# Patient Record
Sex: Female | Born: 1965 | Race: White | Hispanic: No | Marital: Married | Smoking: Never smoker
Health system: Southern US, Community
[De-identification: ages and names within clinical notes are randomized; demographics above are authoritative.]

## PROBLEM LIST (undated history)

## (undated) DIAGNOSIS — I10 Essential (primary) hypertension: Secondary | ICD-10-CM

## (undated) HISTORY — PX: WISDOM TOOTH EXTRACTION: SHX21

## (undated) HISTORY — PX: ABDOMINAL HYSTERECTOMY: SHX81

## (undated) HISTORY — PX: APPENDECTOMY: SHX54

## (undated) HISTORY — PX: BREAST BIOPSY: SHX20

---

## 1999-06-24 ENCOUNTER — Emergency Department (HOSPITAL_COMMUNITY): Admission: EM | Admit: 1999-06-24 | Discharge: 1999-06-24 | Payer: Self-pay | Admitting: Emergency Medicine

## 2002-06-23 ENCOUNTER — Observation Stay (HOSPITAL_COMMUNITY): Admission: EM | Admit: 2002-06-23 | Discharge: 2002-06-24 | Payer: Self-pay | Admitting: Emergency Medicine

## 2002-06-23 ENCOUNTER — Encounter (INDEPENDENT_AMBULATORY_CARE_PROVIDER_SITE_OTHER): Payer: Self-pay | Admitting: Specialist

## 2003-01-27 ENCOUNTER — Inpatient Hospital Stay (HOSPITAL_COMMUNITY): Admission: RE | Admit: 2003-01-27 | Discharge: 2003-01-29 | Payer: Self-pay | Admitting: Obstetrics and Gynecology

## 2003-01-27 ENCOUNTER — Encounter (INDEPENDENT_AMBULATORY_CARE_PROVIDER_SITE_OTHER): Payer: Self-pay

## 2005-11-26 ENCOUNTER — Encounter: Admission: RE | Admit: 2005-11-26 | Discharge: 2005-11-26 | Payer: Self-pay | Admitting: Obstetrics and Gynecology

## 2006-06-06 ENCOUNTER — Encounter: Admission: RE | Admit: 2006-06-06 | Discharge: 2006-06-06 | Payer: Self-pay | Admitting: Obstetrics and Gynecology

## 2008-04-22 ENCOUNTER — Encounter: Admission: RE | Admit: 2008-04-22 | Discharge: 2008-04-22 | Payer: Self-pay | Admitting: Obstetrics and Gynecology

## 2010-07-07 NOTE — H&P (Signed)
Sherri Barrett, Sherri Barrett                          ACCOUNT NO.:  [id number]   MEDICAL RECORD NO.:  [id number]                   PATIENT TYPE:  EMS   LOCATION:  ED                                   FACILITY:  [facility]   PHYSICIAN:  Sandria Bales. Ezzard Standing, M.D.               DATE OF BIRTH:  1965/08/20   DATE OF ADMISSION:  06/23/2002  DATE OF DISCHARGE:                                HISTORY & PHYSICAL   HISTORY OF PRESENT ILLNESS:  This is a 45 year old white female who awoke  about 5:30 this morning, developing some vague abdominal pain in her lower  abdomen which localized to the right lower quadrant.  She says it hurts and  burns when she moves or is active. She has had maybe very mild nausea but no  diarrhea, no vomiting, and no fever.  She went to [facility] and saw Dr.  Hal Hope this morning who evaluated her, was worried about appendicitis, and  asked me to see her in the emergency room.   She denies any history of peptic ulcer disease, liver disease, pancreatic  disease, colon disease, change in bowel habits.  Her only prior abdominal  surgery was three C sections.  She has had no other abdominal surgery, no  change in bowel habits, blood in her stools.   PAST MEDICAL HISTORY:  She has no allergies.  She is on no medications.   REVIEW OF SYSTEMS:  NEUROLOGIC:  No seizure or loss of consciousness.  PULMONARY:  No history of pneumonia or tuberculosis.  She does not smoke  cigarettes.  CARDIAC:  No history of heart disease or chest pain.  GASTROINTESTINAL:  See History of Present Illness.  UROLOGIC:  No history of  kidney stones or kidney infections.  She has had at least one UTI but has  never seen a urologist for this.  OB:  She has been pregnant three times  with three C sections.   SOCIAL HISTORY:  She is accompanied by her husband and mother-in-law in the  room.   PHYSICAL EXAMINATION:  VITAL SIGNS:  Temperature 97.6, blood pressure  128/75, pulse 95, respirations 22.  GENERAL:   Well-nourished, pleasant white female, alert and cooperative on  physical exam.  HEENT:  Unremarkable.  NECK:  Supple without mass, thyromegaly, or lymphadenopathy.  LUNGS: Clear to auscultation.  HEART:  Regular rate and rhythm without murmur or rub.  ABDOMEN:  Present bowel sounds.  She has some mild to moderate tenderness  wiht some guarding in the right lower quadrant.  She denied any rebound.  She has no palpable mass, no hernia.  Bowel sounds are active.  GU:  Bimanual exam was done by Dr. Hal Hope which reported normal gynecologic  exam.  RECTAL:  Exam by me revealed no rectal mass.  She does have guaiac-negative  stools.  EXTREMITIES:  Good strength in all extremities.  NEUROLOGIC:  Grossly intact.  LABORATORY DATA:  White blood count is in the normal range with a slight  shift from Dr. Wendee Copp office and labs pending at this time and may be  repeated.  Serum electrolytes and urine pregnancy were negative.    IMPRESSION:  Probable early appendicitis.  Discussed with the patient and  her husband about proceeding with laparoscopic exploration and appendectomy.  I think they understand both the potential that she could not have  appendicitis running maybe 15 to 20% but that an early appendicitis may not  well on any diagnostic tests.   Will proceed with laparoscopic appendectomy at this time.                                               Sandria Bales. Ezzard Standing, M.D.    DHN/MEDQ  D:  06/23/2002  T:  06/23/2002  Job:  [id number]   cc:   Clydie Braun L. Hal Hope, M.D.  [address] [place]  [place] [address]  Fax: [phone]   S. Kyra Manges, M.D.  [id number]  [place]  [place] [address]  Fax: [phone]

## 2010-07-07 NOTE — Op Note (Signed)
NAMEBLIMY, NAPOLEON                          ACCOUNT NO.:  [id number]   MEDICAL RECORD NO.:  [id number]                   PATIENT TYPE:  INP   LOCATION:  0102                                 FACILITY:  [facility]   PHYSICIAN:  Sandria Bales. Ezzard Standing, M.D.               DATE OF BIRTH:  Jun 21, 1965   DATE OF PROCEDURE:  06/23/2002  DATE OF DISCHARGE:                                 OPERATIVE REPORT   PREOPERATIVE DIAGNOSIS:  Appendicitis.   POSTOPERATIVE DIAGNOSES:  Questionable early appendicitis, right ovarian  endometrioma.   PROCEDURE:  Laparoscopic appendectomy and laparoscopic oophorectomy.   SURGEON:  Sandria Bales. Ezzard Standing, M.D. for the laparoscopic appendectomy and Dr.  Elana Alm for the laparoscopic right oophorectomy.   ANESTHESIA:  General endotracheal.   ESTIMATED BLOOD LOSS:  Minimal.   INDICATIONS FOR PROCEDURE:  Ms. Stegner is a 45 year old white female who  awoke this morning with right lower quadrant abdominal pain, some mild  nausea but no vomiting with some guarding in the right lower quadrant but no  rebound and active bowel sounds. Her white blood count was normal with a  shift. She was seen originally by Dr. Kandis Mannan at [facility] who asked me to see her in consultation. My impression was that Ms.  Slane had probable early appendicitis. I discussed with her and her husband  about proceeding with attempted laparoscopic appendectomy. I discussed both  the indications and potential complications of the procedure. The potential  complications include but are not limited to bleeding, infection, that it  may not be an appendicitis and some other disease and the need for open  surgery.   DESCRIPTION OF PROCEDURE:  The patient placed in supine position, given a  general endotracheal anesthetic. She had 1 g of ________ before the  operation, she was in PAS stockings, she had a Foley catheter in place, her  abdomen was prepped with Betadine solution and  sterilely draped.   An infraumbilical incision was made and with sharp dissection carried down  to the abdominal cavity. A 12 mm Hasson trocar was inserted through a 12 mm  trocar and secured with a #0 Vicryl suture. Two additional trocars were  placed, a 5 mm Ethicon trocar on the right subcostal location and a 10 mm  trocar in the left lower quadrant location. Abdominal exploration was  carried out, right and left lobe of the liver unremarkable, gallbladder  unremarkable. The appendix had a thickening to the base of the appendix  which may have been early appendicitis but I saw no purulence and no obvious  induration. She had a boggy large uterus and a couple of fibroids. She had a  very large right ovary which probably measured 6-7 cm in length and about 3-  4 cm of width. She had a smaller left ovary which was maybe about 2-3 cm in  size.   I went  on and took her appendix out dividing the mesentery of the appendix  with the harmonic scalpel and the base of the appendix with a 45 mm white  load of endoGIA. The appendix was then placed in an EndoCatch bag, delivered  through the umbilicus and sent to pathology. In manipulating this left  ovary, I was concerned about two things, first this large and I thought more  than just simple ovarian changes with menstrual cycle. The second thing, it  was on a pedicle which was easily twisted back and forth and I worried about  this ovary torting.   I spoke with Dr. Kyra Manges who had been her gynecologist in the past and  he came in and examined her with me and I assisted him in doing a left  laparoscopic oophorectomy and he will dictate that portion of the operation.   I also ran her small bowel from the terminal ileum back about 4 or 5 feet  making sure there was no evidence of Meckel's diverticulum and there was no  other inflammatory mass or lesion in her abdominal cavity.   My feeling is that Ms. Reichenbach had probably an early  appendicitis. She also  had a large right ovarian endometrioma, which of the two were causing her  symptoms and problems would be unclear and have to see the final path that  is on her appendix.   I then removed the trocars in turn. I examined the abdominal wall making  sure there was no bleeding from any trocar site. The umbilical trocar was  closed with a #0 Vicryl suture. The skin at each site was closed with 5-0  Monocryl suture, painted with tinctured Benzoin and Steri-Strips and  sterilely dressed.   Sponge and needle count were correct at the end of the case. I removed the  tenaculum that had been placed in the patient's uterus during the middle of  the case. Her Foley was removed at the end of the case. She tolerated the  procedure well and was returned to the recovery room in good condition.                                                Sandria Bales. Ezzard Standing, M.D.    DHN/MEDQ  D:  06/23/2002  T:  06/24/2002  Job:  [id number]

## 2010-07-07 NOTE — Discharge Summary (Signed)
[name], Sherri Barrett                          ACCOUNT NO.:  [id number]   MEDICAL RECORD NO.:  [id number]                   PATIENT TYPE:  INP   LOCATION:  0446                                 FACILITY:  [facility]   PHYSICIAN:  Katherine Roan, M.D.               DATE OF BIRTH:  [birth year]   DATE OF ADMISSION:  01/27/2003  DATE OF DISCHARGE:  01/29/2003                                 DISCHARGE SUMMARY   PREOPERATIVE DIAGNOSES:  1. Dysmenorrhea.  2. Endometriosis.   DISCHARGE DIAGNOSES:  1. Dysmenorrhea.  2. Endometriosis.   OPERATION PERFORMED:  1. Pelvic examination under anesthesia.  2. Total abdominal hysterectomy.  3. Fulguration of endometriosis of left ovary.   BRIEF HISTORY:  [name] is status post laparoscopy and appendectomy with  findings of acute endometriosis of the appendix and the right tube and  ovary.  We removed her right tube and ovary and appendix.  She, at the time  of surgery, also had focal endometriosis on her left ovary.  She was  complaining of heavy painful periods which really did not resolve with oral  contraceptive therapy and she was now admitted for definitive care.  Hemoglobin on admission was 12.  Hematocrit was 36.  Pathology report  revealed uterine fibroids.   HOSPITAL COURSE:  On the day of admission the patient underwent an  examination under anesthesia, fulguration of endometriosis of the left ovary  with ovarian preservation, and total abdominal hysterectomy.  Her  postoperative course was uncomplicated.  She remained afebrile and without  complaints and was discharged on December 10 to home and office care.  She  was asked to call for fever, bleeding, or any other difficulty.   CONDITION ON DISCHARGE:  Improved.                                               Katherine Roan, M.D.    SDM/MEDQ  D:  02/16/2003  T:  02/16/2003  Job:  [id number]

## 2010-07-07 NOTE — H&P (Signed)
Sherri Barrett, Sherri Barrett                          ACCOUNT NO.:  [id number]   MEDICAL RECORD NO.:  [id number]                   PATIENT TYPE:  INP   LOCATION:  NA                                   FACILITY:  [facility]   PHYSICIAN:  Katherine Roan, M.D.               DATE OF BIRTH:  15-Feb-1966   DATE OF ADMISSION:  DATE OF DISCHARGE:                                HISTORY & PHYSICAL   CHIEF COMPLAINT:  Pelvic pain and dysmenorrhea.   HISTORY OF PRESENT ILLNESS:  Sherri Barrett is a 45 year old gravida 3, para 3,  status post cesarean section x3 who in May of 2004 underwent a laparoscopy  with the finding of a large endometriotic cyst of her right ovary and  endometriosis involving the appendix.  She underwent a laparoscopic  appendectomy and right salpingo-oophorectomy.  At the time of the initial  surgery which was done on an emergency basis, there was endometriosis in the  residual ovary.  She continues to have pelvic pain and is desirous of  completion of her therapy for endometriosis.  She has no known allergies and  is on no medications.   REVIEW OF SYSTEMS:  HEENT:  She wears glasses but no decrease in visual or  auditory acuity.  No dizziness.  HEART:  No hypertension, no rheumatic  fever, no history of mitral valve prolapse. LUNGS:  No chronic cough.  She  does not smoke.  No shortness of breath.  No history of asthma.  GU:  She  denies stress incontinence.  GI:  No bowel habit change.  No melena.  MUSCLE, BONES, AND JOINTS:  No fractures or arthritis.   SOCIAL HISTORY:  She works regularly, does not smoke or drink.   FAMILY HISTORY:  Her mother and father are both living and well.  One  brother is in good health.  Her mother had breast cancer and her father has  had a heart attack at age 42.  He is doing well at this time.   PHYSICAL EXAMINATION:  GENERAL:  A well-developed, nourished, alert,  pleasant 45 year old female who is oriented to time, place, and recent  events.  VITAL SIGNS:   Weight 193, blood pressure 130/80, pulse 80.  EARS, NOSE AND THROAT:  Unremarkable.  Oropharynx is not injected.  NECK:  Supple.  Carotid pulses are equal without bruits.  Trachea is in the  midline.  The thyroid does not appear to be enlarged.  No adenopathy  appreciated.  BREASTS:  No masses or tenderness.  Axilla free from adenopathy.  LUNGS:  Clear to P&A.  HEART:  Normal sinus rhythm.  No murmurs.  ABDOMEN:  Soft, on plain.  Liver, spleen, or kidneys are not palpated.  Bowel sounds are normal and no bruits are heard.  No tenderness.  There is a  low transverse incision.  EXTREMITIES:  Trace of edema, equal pulses and reflexes.  PELVIC:  Reveals a clean cervix.  Uterus is in the midline, slightly boggy  and tender in the left adnexa.  No masses are felt. Rectovaginal confirms.   IMPRESSION:  Endometriosis of pelvis.   PLAN:  TAH, LSO with hormone replacement therapy.  Risks and benefits have  been discussed with [name].  Specifically, we have addressed the issue of  pelvic pain, persistent, with damage to bladder, bowel, and infection and  hemorrhage. She is aware of these risks and desires to proceed.                                               Katherine Roan, M.D.    SDM/MEDQ  D:  01/25/2003  T:  01/25/2003  Job:  [id number]

## 2010-07-07 NOTE — Op Note (Signed)
   NAME:  Sherri Barrett, Sherri Barrett                          ACCOUNT NO.:  [id number]   MEDICAL RECORD NO.:  [id number]                   PATIENT TYPE:  INP   LOCATION:  0440                                 FACILITY:  [facility]   PHYSICIAN:  Katherine Roan, M.D.               DATE OF BIRTH:   DATE OF PROCEDURE:  06/23/2002  DATE OF DISCHARGE:                                 OPERATIVE REPORT   INDICATIONS FOR PROCEDURE:  The patient was in the operating room and had a  laparoscopic appendectomy by Dr. Ezzard Standing.  There was a 12 mm port in the  umbilicus, a 10 mm port in the left lower quadrant, and a 5 mm port in the  right mid quadrant.  Visualization of the pelvis revealed an enlarged  myomatous uterus.  She is status post cesarean section and tubal  sterilization.  The right ovary contained a large 8 cm hemorrhagic mass.  The left ovary had some discolored material on the surface, but appeared to  be within normal limits.  Because of the large area on the ovary and the  evidence that it was an endometrioma, a right salpingo-oophorectomy was  performed.   DESCRIPTION OF PROCEDURE:  We tried to use a Hulka elevator, but since she  was only in the frog-leg position I was unable to manipulate the uterus well  at all.  We lifted up the ovary, brought it up anteriorly over the uterus,  and transected the infundibulopelvic ligament and the utero-ovarian ligament  to remove the large endometrioma.  This was then extracted through the  umbilical port with Endo bag.  Following this  Dr. Ezzard Standing closed the incisions.   DISPOSITION:  The patient tolerated the procedure well.   FINAL DIAGNOSIS:  Endometrioma of right ovary.                                               Katherine Roan, M.D.    SDM/MEDQ  D:  06/23/2002  T:  06/24/2002  Job:  [id number]   cc:   Sandria Bales. Ezzard Standing, M.D.  [address]  [place]  [place] [address]  Fax: [phone]

## 2010-07-07 NOTE — Op Note (Signed)
NAMEJERLENE, ROCKERS                          ACCOUNT NO.:  [id number]   MEDICAL RECORD NO.:  [id number]                   PATIENT TYPE:  INP   LOCATION:  V784                                 FACILITY:  [facility]   PHYSICIAN:  Katherine Roan, M.D.               DATE OF BIRTH:  1965/03/28   DATE OF PROCEDURE:  DATE OF DISCHARGE:                                 OPERATIVE REPORT   PREOPERATIVE DIAGNOSES:  Dysmenorrhea and pelvic pain, history of  endometriosis.   POSTOPERATIVE DIAGNOSES:  Dysmenorrhea and pelvic pain, history of  endometriosis.   OPERATION:  Pelvic exam under anesthesia, exploratory laparotomy, total  abdominal hysterectomy, fulguration of focal endometriosis of left ovary.   DESCRIPTION OF PROCEDURE:  The patient was placed in the lithotomy position,  general endotracheal anesthesia was instituted. Pelvic exam under anesthesia  revealed a mobile uterus with no adnexal masses.  Prepped and draped in the  usual fashion, the incision was infiltrated with 20 mL of 0.5% Marcaine  following which I made a transverse Pfannenstiel incision extended in layers  to the fascia which was opened transversely.  Hemostasis was accomplished  with the Bovie. The midline of the fascia was then dissected superiorly and  inferiorly and the midline was entered.  The exploration of the upper  abdomen revealed a smooth liver, gallbladder, two normal kidneys.  The  abdominal viscera were packed away from the pelvic viscera, the uterus was  normal, there was focal endometriosis on the right side of the uterus.  The  left ovary was cystic but no evidence of endometriosis other than one small  area. Because of her age, I elected to save the ovary.  The cornual aspects  of the uterus was grasped, round ligaments were suture ligated, the  uteroovarian ligament and tubal structure was clamped and ligated with #0  Chromic suture. The infundibulopelvic ligament on the right where she  previously had  a right salpingo-oophorectomy was clamped and ligated.  Uterine vessels were skeletonized.  The bladder flap was pushed off the  lower segment.  Using careful dissection, the uterus removed using the  clamps suturing with #0 chromic suture.  The uterosacral ligaments were  identified and clamped and ligated with #0 chromic, they were tagged. The  angles of the vagina were entered, the vagina was then closed with a figure-  of-eight suture of #0 Vicryl.  Hemostasis was secure.  We did a culdoplasty  in order to prevent enterocele formation. The retroperitoneal space was then  examined, there was no oozing and I closed that with interrupted sutures of  3-0 Vicryl.  Following this, I looked at the ovary again and there was a  focal area of endometriosis on it and so I treated that with Bovie.  I  sutured the left ovary to the round ligament with three  sutures of 3-0 Vicryl.  Rochel tolerated this part of the  procedure.  Estimated blood loss was about 100 mL.  The parietoperitoneum was then  closed with 2-0 PDS, the fascia was closed with 2-0 PDS and the skin was  closed with 3-0 plain gut.  The patient tolerated the procedure well.                                               Katherine Roan, M.D.    SDM/MEDQ  D:  01/27/2003  T:  01/28/2003  Job:  [id number]   cc:   Sandria Bales. Ezzard Standing, M.D.  [address], [address]  [place]  [place] [address]  Fax: [phone]   Raynald Kemp, M.D.  [address]  [place]  [place] [address]  Fax: [phone]

## 2011-03-30 ENCOUNTER — Other Ambulatory Visit: Payer: Self-pay | Admitting: Family Medicine

## 2011-03-30 DIAGNOSIS — N644 Mastodynia: Secondary | ICD-10-CM

## 2011-04-02 ENCOUNTER — Other Ambulatory Visit: Payer: Self-pay | Admitting: Family Medicine

## 2011-04-02 ENCOUNTER — Ambulatory Visit
Admission: RE | Admit: 2011-04-02 | Discharge: 2011-04-02 | Disposition: A | Discharge status: Home / Self Care | Payer: 59 | Source: Ambulatory Visit | Attending: Family Medicine | Admitting: Family Medicine

## 2011-04-02 DIAGNOSIS — N644 Mastodynia: Secondary | ICD-10-CM

## 2011-04-02 MED ORDER — DOXYCYCLINE HYCLATE 100 MG PO TABS: 100.0000 mg | ORAL_TABLET | Freq: Two times a day (BID) | ORAL | Status: AC

## 2011-04-16 ENCOUNTER — Other Ambulatory Visit: Payer: Self-pay | Admitting: Family Medicine

## 2011-04-16 ENCOUNTER — Ambulatory Visit
Admission: RE | Admit: 2011-04-16 | Discharge: 2011-04-16 | Disposition: A | Discharge status: Home / Self Care | Payer: 59 | Source: Ambulatory Visit | Attending: Family Medicine | Admitting: Family Medicine

## 2011-04-16 DIAGNOSIS — N644 Mastodynia: Secondary | ICD-10-CM

## 2011-05-01 ENCOUNTER — Ambulatory Visit
Admission: RE | Admit: 2011-05-01 | Discharge: 2011-05-01 | Disposition: A | Discharge status: Home / Self Care | Payer: 59 | Source: Ambulatory Visit | Attending: Family Medicine | Admitting: Family Medicine

## 2011-05-01 ENCOUNTER — Other Ambulatory Visit: Payer: Self-pay | Admitting: Family Medicine

## 2011-05-01 DIAGNOSIS — N644 Mastodynia: Secondary | ICD-10-CM

## 2012-05-05 ENCOUNTER — Other Ambulatory Visit: Payer: Self-pay

## 2012-05-05 DIAGNOSIS — Z1231 Encounter for screening mammogram for malignant neoplasm of breast: Secondary | ICD-10-CM

## 2012-05-23 ENCOUNTER — Ambulatory Visit
Admission: RE | Admit: 2012-05-23 | Discharge: 2012-05-23 | Disposition: A | Discharge status: Home / Self Care | Payer: 59 | Source: Ambulatory Visit

## 2012-05-23 DIAGNOSIS — Z1231 Encounter for screening mammogram for malignant neoplasm of breast: Secondary | ICD-10-CM

## 2013-07-06 ENCOUNTER — Other Ambulatory Visit: Payer: Self-pay

## 2013-07-06 DIAGNOSIS — Z1231 Encounter for screening mammogram for malignant neoplasm of breast: Secondary | ICD-10-CM

## 2013-07-27 ENCOUNTER — Ambulatory Visit
Admission: RE | Admit: 2013-07-27 | Discharge: 2013-07-27 | Disposition: A | Discharge status: Home / Self Care | Payer: 59 | Source: Ambulatory Visit

## 2013-07-27 ENCOUNTER — Encounter (INDEPENDENT_AMBULATORY_CARE_PROVIDER_SITE_OTHER): Payer: Self-pay

## 2013-07-27 DIAGNOSIS — Z1231 Encounter for screening mammogram for malignant neoplasm of breast: Secondary | ICD-10-CM

## 2013-12-30 ENCOUNTER — Other Ambulatory Visit: Payer: Self-pay | Admitting: Nurse Practitioner

## 2013-12-30 DIAGNOSIS — N63 Unspecified lump in unspecified breast: Secondary | ICD-10-CM

## 2013-12-31 ENCOUNTER — Encounter: Payer: Self-pay | Admitting: Internal Medicine

## 2014-01-12 ENCOUNTER — Ambulatory Visit
Admission: RE | Admit: 2014-01-12 | Discharge: 2014-01-12 | Disposition: A | Discharge status: Home / Self Care | Payer: 59 | Source: Ambulatory Visit | Attending: Nurse Practitioner | Admitting: Nurse Practitioner

## 2014-01-12 DIAGNOSIS — N63 Unspecified lump in unspecified breast: Secondary | ICD-10-CM

## 2014-02-25 ENCOUNTER — Emergency Department (HOSPITAL_COMMUNITY)
Admission: EM | Admit: 2014-02-25 | Discharge: 2014-02-25 | Disposition: A | Discharge status: Home / Self Care | Payer: 59 | Attending: Emergency Medicine | Admitting: Emergency Medicine

## 2014-02-25 ENCOUNTER — Encounter (HOSPITAL_COMMUNITY): Payer: Self-pay | Admitting: *Deleted

## 2014-02-25 DIAGNOSIS — R55 Syncope and collapse: Secondary | ICD-10-CM

## 2014-02-25 DIAGNOSIS — I951 Orthostatic hypotension: Secondary | ICD-10-CM | POA: Diagnosis not present

## 2014-02-25 DIAGNOSIS — E876 Hypokalemia: Secondary | ICD-10-CM | POA: Insufficient documentation

## 2014-02-25 DIAGNOSIS — Z79899 Other long term (current) drug therapy: Secondary | ICD-10-CM | POA: Diagnosis not present

## 2014-02-25 DIAGNOSIS — I10 Essential (primary) hypertension: Secondary | ICD-10-CM | POA: Insufficient documentation

## 2014-02-25 HISTORY — DX: Essential (primary) hypertension: I10

## 2014-02-25 LAB — CBC WITH DIFFERENTIAL/PLATELET
Basophils Absolute: 0 10*3/uL (ref 0.0–0.1)
Basophils Relative: 0 % (ref 0–1)
EOS ABS: 0.1 10*3/uL (ref 0.0–0.7)
EOS PCT: 1 % (ref 0–5)
HCT: 38.5 % (ref 36.0–46.0)
HEMOGLOBIN: 13.3 g/dL (ref 12.0–15.0)
Lymphocytes Relative: 11 % — ABNORMAL LOW (ref 12–46)
Lymphs Abs: 1 10*3/uL (ref 0.7–4.0)
MCH: 31.4 pg (ref 26.0–34.0)
MCHC: 34.5 g/dL (ref 30.0–36.0)
MCV: 90.8 fL (ref 78.0–100.0)
Monocytes Absolute: 0.5 10*3/uL (ref 0.1–1.0)
Monocytes Relative: 6 % (ref 3–12)
Neutro Abs: 7.2 10*3/uL (ref 1.7–7.7)
Neutrophils Relative %: 82 % — ABNORMAL HIGH (ref 43–77)
PLATELETS: 199 10*3/uL (ref 150–400)
RBC: 4.24 MIL/uL (ref 3.87–5.11)
RDW: 12.7 % (ref 11.5–15.5)
WBC: 8.7 10*3/uL (ref 4.0–10.5)

## 2014-02-25 LAB — COMPREHENSIVE METABOLIC PANEL
ALK PHOS: 60 U/L (ref 39–117)
ALT: 15 U/L (ref 0–35)
AST: 24 U/L (ref 0–37)
Albumin: 4 g/dL (ref 3.5–5.2)
Anion gap: 14 (ref 5–15)
BILIRUBIN TOTAL: 1.2 mg/dL (ref 0.3–1.2)
BUN: 11 mg/dL (ref 6–23)
CHLORIDE: 100 meq/L (ref 96–112)
CO2: 19 mmol/L (ref 19–32)
Calcium: 9.2 mg/dL (ref 8.4–10.5)
Creatinine, Ser: 0.8 mg/dL (ref 0.50–1.10)
GFR calc Af Amer: >90 mL/min (ref >90)
GFR calc non Af Amer: 85 mL/min — ABNORMAL LOW (ref >90)
Glucose, Bld: 103 mg/dL — ABNORMAL HIGH (ref 70–99)
POTASSIUM: 3.1 mmol/L — AB (ref 3.5–5.1)
SODIUM: 133 mmol/L — AB (ref 135–145)
Total Protein: 7 g/dL (ref 6.0–8.3)

## 2014-02-25 MED ORDER — POTASSIUM CHLORIDE CRYS ER 20 MEQ PO TBCR
40.0000 meq | EXTENDED_RELEASE_TABLET | Freq: Once | ORAL | Status: AC
  Administered 2014-02-25: 40 meq via ORAL
  Filled 2014-02-25: qty 2

## 2014-02-25 NOTE — ED Notes (Signed)
CBG 126  

## 2014-02-25 NOTE — ED Notes (Signed)
Pt in via EMS, per EMS pt was working out x1 hour and developed numbness in hands and feet and felt dizzy, positive orthostatics for EMS 740 systolic HR 814 to 481 systolic HR 856 standing, started BP medication in December, has not eaten since 1pm today, symptoms improved with fluid bolus.

## 2014-02-25 NOTE — Discharge Instructions (Signed)
Hypokalemia Hypokalemia means that the amount of potassium in the blood is lower than normal.Potassium is a chemical, called an electrolyte, that helps regulate the amount of fluid in the body. It also stimulates muscle contraction and helps nerves function properly.Most of the body's potassium is inside of cells, and only a very small amount is in the blood. Because the amount in the blood is so small, minor changes can be life-threatening. CAUSES  Antibiotics.  Diarrhea or vomiting.  Using laxatives too much, which can cause diarrhea.  Chronic kidney disease.  Water pills (diuretics).  Eating disorders (bulimia).  Low magnesium level.  Sweating a lot. SIGNS AND SYMPTOMS  Weakness.  Constipation.  Fatigue.  Muscle cramps.  Mental confusion.  Skipped heartbeats or irregular heartbeat (palpitations).  Tingling or numbness. DIAGNOSIS  Your health care provider can diagnose hypokalemia with blood tests. In addition to checking your potassium level, your health care provider may also check other lab tests. TREATMENT Hypokalemia can be treated with potassium supplements taken by mouth or adjustments in your current medicines. If your potassium level is very low, you may need to get potassium through a vein (IV) and be monitored in the hospital. A diet high in potassium is also helpful. Foods high in potassium are:  Nuts, such as peanuts and pistachios.  Seeds, such as sunflower seeds and pumpkin seeds.  Peas, lentils, and lima beans.  Whole grain and bran cereals and breads.  Fresh fruit and vegetables, such as apricots, avocado, bananas, cantaloupe, kiwi, oranges, tomatoes, asparagus, and potatoes.  Orange and tomato juices.  Red meats.  Fruit yogurt. HOME CARE INSTRUCTIONS  Take all medicines as prescribed by your health care provider.  Maintain a healthy diet by including nutritious food, such as fruits, vegetables, nuts, whole grains, and lean meats.  If  you are taking a laxative, be sure to follow the directions on the label. SEEK MEDICAL CARE IF:  Your weakness gets worse.  You feel your heart pounding or racing.  You are vomiting or having diarrhea.  You are diabetic and having trouble keeping your blood glucose in the normal range. SEEK IMMEDIATE MEDICAL CARE IF:  You have chest pain, shortness of breath, or dizziness.  You are vomiting or having diarrhea for more than 2 days.  You faint. MAKE SURE YOU:   Understand these instructions.  Will watch your condition.  Will get help right away if you are not doing well or get worse. Document Released: 02/05/2005 Document Revised: 11/26/2012 Document Reviewed: 08/08/2012 [facility] Patient Information 2015 [place], [place]. This information is not intended to replace advice given to you by your health care provider. Make sure you discuss any questions you have with your health care provider.  Near-Syncope Near-syncope (commonly known as near fainting) is sudden weakness, dizziness, or feeling like you might pass out. During an episode of near-syncope, you may also develop pale skin, have tunnel vision, or feel sick to your stomach (nauseous). Near-syncope may occur when getting up after sitting or while standing for a long time. It is caused by a sudden decrease in blood flow to the brain. This decrease can result from various causes or triggers, most of which are not serious. However, because near-syncope can sometimes be a sign of something serious, a medical evaluation is required. The specific cause is often not determined. HOME CARE INSTRUCTIONS  Monitor your condition for any changes. The following actions may help to alleviate any discomfort you are experiencing:  Have someone stay with you  until you feel stable.  Lie down right away and prop your feet up if you start feeling like you might faint. Breathe deeply and steadily. Wait until all the symptoms have passed. Most of these  episodes last only a few minutes. You may feel tired for several hours.   Drink enough fluids to keep your urine clear or pale yellow.   If you are taking blood pressure or heart medicine, get up slowly when seated or lying down. Take several minutes to sit and then stand. This can reduce dizziness.  Follow up with your health care provider as directed. SEEK IMMEDIATE MEDICAL CARE IF:   You have a severe headache.   You have unusual pain in the chest, abdomen, or back.   You are bleeding from the mouth or rectum, or you have black or tarry stool.   You have an irregular or very fast heartbeat.   You have repeated fainting or have seizure-like jerking during an episode.   You faint when sitting or lying down.   You have confusion.   You have difficulty walking.   You have severe weakness.   You have vision problems.  MAKE SURE YOU:  Orthostatic Hypotension Orthostatic hypotension is a sudden drop in blood pressure. It happens when you quickly stand up from a seated or lying position. You may feel dizzy or light-headed. This can last for just a few seconds or for up to a few minutes. It is usually not a serious problem. However, if this happens frequently or gets worse, it can be a sign of something more serious. CAUSES  Different things can cause orthostatic hypotension, including:   Loss of body fluids (dehydration).  Medicines that lower blood pressure.  Sudden changes in posture, such as standing up quickly after you have been sitting or lying down.  Taking too much of your medicine. SIGNS AND SYMPTOMS   Light-headedness or dizziness.   Fainting or near-fainting.   A fast heart rate.   Weakness.   Feeling tired (fatigue).  DIAGNOSIS  Your health care provider may do several things to help diagnose your condition and identify the cause. These may include:   Taking a medical history and doing a physical exam.  Checking your blood pressure.  Your health care provider will check your blood pressure when you are:  Lying down.  Sitting.  Standing.  Using tilt table testing. In this test, you lie down on a table that moves from a lying position to a standing position. You will be strapped onto the table. This test monitors your blood pressure and heart rate when you are in different positions. TREATMENT  Treatment will vary depending on the cause. Possible treatments include:   Changing the dosage of your medicines.  Wearing compression stockings on your lower legs.  Standing up slowly after sitting or lying down.  Eating more salt.  Eating frequent, small meals.  In some cases, getting IV fluids.  Taking medicine to enhance fluid retention. HOME CARE INSTRUCTIONS  Only take over-the-counter or prescription medicines as directed by your health care provider.  Follow your health care provider's instructions for changing the dosage of your current medicines.  Do not stop or adjust your medicine on your own.  Stand up slowly after sitting or lying down. This allows your body to adjust to the different position.  Wear compression stockings as directed.  Eat extra salt as directed.  Do not add extra salt to your diet unless directed to by  your health care provider.  Eat frequent, small meals.  Avoid standing suddenly after eating.  Avoid hot showers or excessive heat as directed by your health care provider.  Keep all follow-up appointments. SEEK MEDICAL CARE IF:  You continue to feel dizzy or light-headed after standing.  You feel groggy or confused.  You feel cold, clammy, or sick to your stomach (nauseous).  You have blurred vision.  You feel short of breath. SEEK IMMEDIATE MEDICAL CARE IF:   You faint after standing.  You have chest pain.  You have difficulty breathing.   You lose feeling or movement in your arms or legs.   You have slurred speech or difficulty talking, or you are  unable to talk.  MAKE SURE YOU:   Understand these instructions.  Will watch your condition.  Will get help right away if you are not doing well or get worse. Document Released: 01/26/2002 Document Revised: 02/10/2013 Document Reviewed: 11/28/2012 [facility] Patient Information 2015 [place], [place]. This information is not intended to replace advice given to you by your health care provider. Make sure you discuss any questions you have with your health care provider.   Understand these instructions.  Will watch your condition.  Will get help right away if you are not doing well or get worse. Document Released: 02/05/2005 Document Revised: 02/10/2013 Document Reviewed: 07/11/2012 [facility] Patient Information 2015 [place], [place]. This information is not intended to replace advice given to you by your health care provider. Make sure you discuss any questions you have with your health care provider.

## 2014-02-25 NOTE — ED Provider Notes (Signed)
CSN: [id number]     Arrival date & time 02/25/14  2011 History   First MD Initiated Contact with Patient 02/25/14 2155     Chief Complaint  Patient presents with  . Near Syncope    Patient is a 49 y.o. female presenting with near-syncope. The history is provided by the patient. No language interpreter was used.  Near Syncope   Ms Holdsworth presents for evaluation of near-syncope. She was working out much longer than she normally does today. When she went to leave the gym she developed numbness in bilateral hands and feet and felt like she might pass out. She went to lay down. She thought that she needed a snack since she hadn't eaten since 1 this afternoon so she tried to eat something but that made her feel nauseous. When EMS arrived she was found to be orthostatic and brought into the emergency department for further evaluation. Patient denies any chest pain, shortness of breath, leg swelling or pain. She has no history of similar previous symptoms. She was started on HCTZ for high blood pressure about a month ago and she had a recheck not that long ago where she maintained her current dosage. She currently has no symptoms and feels improved compared to earlier today.  Past Medical History  Diagnosis Date  . Hypertension    History reviewed. No pertinent past surgical history. History reviewed. No pertinent family history. History  Substance Use Topics  . Smoking status: Never Smoker   . Smokeless tobacco: Not on file  . Alcohol Use: Not on file   OB History    No data available     Review of Systems  Cardiovascular: Positive for near-syncope.  All other systems reviewed and are negative.     Allergies  Review of patient's allergies indicates no known allergies.  Home Medications   Prior to Admission medications   Medication Sig Start Date End Date Taking? Authorizing Provider  hydrochlorothiazide (HYDRODIURIL) 25 MG tablet Take 25 mg by mouth daily.   Yes Historical  Provider, MD   BP 125/75 mmHg  Pulse 89  Temp(Src) 97.8 F (36.6 C) (Oral)  Resp 19  Ht 5\' 6"  (1.676 m)  Wt 202 lb (91.627 kg)  BMI 32.62 kg/m2  SpO2 99% Physical Exam  Constitutional: She is oriented to person, place, and time. She appears well-developed and well-nourished.  HENT:  Head: Normocephalic and atraumatic.  Cardiovascular: Normal rate and regular rhythm.   No murmur heard. Pulmonary/Chest: Effort normal and breath sounds normal. No respiratory distress.  Abdominal: Soft. There is no tenderness. There is no rebound and no guarding.  Musculoskeletal: She exhibits no edema or tenderness.  Neurological: She is alert and oriented to person, place, and time.  Skin: Skin is warm and dry.  Psychiatric: She has a normal mood and affect. Her behavior is normal.  Nursing note and vitals reviewed.   ED Course  Procedures (including critical care time) Labs Review Labs Reviewed  CBC WITH DIFFERENTIAL - Abnormal; Notable for the following:    Neutrophils Relative % 82 (*)    Lymphocytes Relative 11 (*)    All other components within normal limits  COMPREHENSIVE METABOLIC PANEL - Abnormal; Notable for the following:    Sodium 133 (*)    Potassium 3.1 (*)    Glucose, Bld 103 (*)    GFR calc non Af Amer 85 (*)    All other components within normal limits    Imaging Review No results found.  EKG Interpretation   Date/Time:  Thursday February 25 2014 20:26:48 EST Ventricular Rate:  81 PR Interval:  161 QRS Duration: 104 QT Interval:  449 QTC Calculation: 521 R Axis:   52 Text Interpretation:  Sinus rhythm Prolonged QT interval Confirmed by  Hazle Coca [id number]) on 02/25/2014 8:34:55 PM Also confirmed by Hazle Coca  [phone])  on 02/25/2014 10:32:44 PM      MDM   Final diagnoses:  Near syncope  Orthostatic hypotension  Hypokalemia    Patient here for evaluation of near syncope after exercise. Symptoms did not occur during exercise. Patient is orthostatic by EMS and  on recheck in the emergency department. Patient is tolerating oral fluids quite well, discussed with patient oral fluid hydration. Patient is mildly hypo-[facility] [place] hypokalemic, this is likely secondary to HCTZ use. Discussed with patient and PCP follow-up M.D. Seeing HCTZ for now. Discussed oral fluid hydration. Discussed close return precautions. Discussed abstaining from heavy activity until recheck.clinical picture not consistent with ACS, PE, life-threatening arrhythmia.    Quintella Reichert, MD 02/25/14 [phone]

## 2014-02-25 NOTE — ED Notes (Signed)
MD at bedside. 

## 2014-03-05 ENCOUNTER — Encounter: Payer: 59 | Admitting: Internal Medicine

## 2014-03-16 ENCOUNTER — Encounter: Payer: Self-pay | Admitting: Nurse Practitioner

## 2014-03-18 ENCOUNTER — Ambulatory Visit (AMBULATORY_SURGERY_CENTER): Payer: Self-pay | Admitting: *Deleted

## 2014-03-18 VITALS — Ht 66.0 in | Wt 206.4 lb

## 2014-03-18 DIAGNOSIS — Z1211 Encounter for screening for malignant neoplasm of colon: Secondary | ICD-10-CM

## 2014-03-18 MED ORDER — MOVIPREP 100 G PO SOLR: 1.0000 | Freq: Once | ORAL | Status: DC

## 2014-03-18 NOTE — Progress Notes (Signed)
No home 02 use No diet pills No issues with past sedation No egg or soy allergy Pt declined emmi

## 2014-04-01 ENCOUNTER — Encounter: Payer: 59 | Admitting: Internal Medicine

## 2014-04-01 ENCOUNTER — Encounter: Payer: Self-pay | Admitting: Internal Medicine

## 2014-04-01 ENCOUNTER — Ambulatory Visit (AMBULATORY_SURGERY_CENTER): Payer: 59 | Admitting: Internal Medicine

## 2014-04-01 VITALS — BP 117/64 | HR 56 | Temp 97.4°F | Resp 39 | Ht 66.0 in | Wt 206.0 lb

## 2014-04-01 DIAGNOSIS — D124 Benign neoplasm of descending colon: Secondary | ICD-10-CM

## 2014-04-01 DIAGNOSIS — Z1211 Encounter for screening for malignant neoplasm of colon: Secondary | ICD-10-CM

## 2014-04-01 DIAGNOSIS — D128 Benign neoplasm of rectum: Secondary | ICD-10-CM

## 2014-04-01 DIAGNOSIS — K621 Rectal polyp: Secondary | ICD-10-CM

## 2014-04-01 DIAGNOSIS — D129 Benign neoplasm of anus and anal canal: Secondary | ICD-10-CM

## 2014-04-01 DIAGNOSIS — K635 Polyp of colon: Secondary | ICD-10-CM

## 2014-04-01 MED ORDER — SODIUM CHLORIDE 0.9 % IV SOLN: 500.0000 mL | INTRAVENOUS | Status: DC

## 2014-04-01 NOTE — Progress Notes (Signed)
Patient awakening,vss,report to rn 

## 2014-04-01 NOTE — Patient Instructions (Signed)

## 2014-04-01 NOTE — Progress Notes (Signed)
Called to room to assist during endoscopic procedure.  Patient ID and intended procedure confirmed with present staff. Received instructions for my participation in the procedure from the performing physician.  

## 2014-04-01 NOTE — Op Note (Signed)
[facility]  [address]. [place], [address]   COLONOSCOPY PROCEDURE REPORT  PATIENT: Sherri, Barrett  MR#: [id number] BIRTHDATE: 02-19-1966 , 48  yrs. old GENDER: female ENDOSCOPIST: Jerene Bears, MD REFERRED BM:[id number] Toy Cookey, NP PROCEDURE DATE:  04/01/2014 PROCEDURE:   Colonoscopy with cold biopsy polypectomy First Screening Colonoscopy - Avg.  risk and is 50 yrs.  old or older Yes.  Prior Negative Screening - Now for repeat screening. N/A  History of Adenoma - Now for follow-up colonoscopy & has been > or = to 3 yrs.  N/A  Polyps Removed Today? Yes. ASA CLASS:   Class II INDICATIONS:average risk patient for colon cancer, 1st colonoscopy.  MEDICATIONS: Monitored anesthesia care and Propofol 200 mg IV  DESCRIPTION OF PROCEDURE:   After the risks benefits and alternatives of the procedure were thoroughly explained, informed consent was obtained.  The digital rectal exam revealed no rectal mass.   The LB [id number]-[id number]  endoscope was introduced through the anus and advanced to the cecum, which was identified by both the appendix and ileocecal valve. No adverse events experienced. The quality of the prep was good, using MoviPrep  The instrument was then slowly withdrawn as the colon was fully examined.  COLON FINDINGS: Two sessile polyps ranging from 3 to 38mm in size were found in the descending colon and rectum.  Polypectomies were performed with cold forceps.  The resection was complete, the polyp tissue was completely retrieved and sent to histology.   The examination was otherwise normal.  Retroflexed views revealed no abnormalities. The time to cecum=3 minutes 40 seconds.  Withdrawal time=11 minutes 13 seconds.  The scope was withdrawn and the procedure completed. COMPLICATIONS: There were no immediate complications.  ENDOSCOPIC IMPRESSION: 1.   Two sessile polyps ranging from 3 to 57mm in size were found in the descending colon and rectum; polypectomies  were performed with cold forceps 2.   The examination was otherwise normal  RECOMMENDATIONS: 1.  Await pathology results 2.  If the polyps removed today are proven to be adenomatous (pre-cancerous) polyps, you will need a repeat colonoscopy in 5 years.  Otherwise you should continue to follow colorectal cancer screening guidelines for "routine risk" patients with colonoscopy in 10 years.  You will receive a letter within 1-2 weeks with the results of your biopsy as well as final recommendations.  Please call my office if you have not received a letter after 3 weeks.  eSigned:  Jerene Bears, MD 04/01/2014 9:50 AM   cc: Delia Chimes NP and The Patient

## 2014-04-02 ENCOUNTER — Telehealth: Payer: Self-pay | Admitting: *Deleted

## 2014-04-02 NOTE — Telephone Encounter (Signed)
  Follow up Call-  Call back number 04/01/2014  Post procedure Call Back phone  # [phone]  Permission to leave phone message Yes     Patient questions:  Do you have a fever, pain , or abdominal swelling? Yes.   Pain Score  2 *  Have you tolerated food without any problems? Yes.    Have you been able to return to your normal activities? Yes.    Do you have any questions about your discharge instructions: Diet   No. Medications  No. Follow up visit  No.  Do you have questions or concerns about your Care? No.  Actions: * If pain score is 4 or above: No action needed, pain <4.

## 2014-04-06 ENCOUNTER — Encounter: Payer: Self-pay | Admitting: Internal Medicine

## 2015-04-12 ENCOUNTER — Other Ambulatory Visit: Payer: Self-pay

## 2015-04-12 DIAGNOSIS — Z1231 Encounter for screening mammogram for malignant neoplasm of breast: Secondary | ICD-10-CM

## 2015-05-02 ENCOUNTER — Ambulatory Visit
Admission: RE | Admit: 2015-05-02 | Discharge: 2015-05-02 | Disposition: A | Discharge status: Home / Self Care | Payer: 59 | Source: Ambulatory Visit

## 2015-05-02 DIAGNOSIS — Z1231 Encounter for screening mammogram for malignant neoplasm of breast: Secondary | ICD-10-CM

## 2015-05-05 ENCOUNTER — Other Ambulatory Visit: Payer: Self-pay | Admitting: Nurse Practitioner

## 2015-05-05 DIAGNOSIS — R928 Other abnormal and inconclusive findings on diagnostic imaging of breast: Secondary | ICD-10-CM

## 2015-05-11 ENCOUNTER — Ambulatory Visit
Admission: RE | Admit: 2015-05-11 | Discharge: 2015-05-11 | Disposition: A | Discharge status: Home / Self Care | Payer: 59 | Source: Ambulatory Visit | Attending: Nurse Practitioner | Admitting: Nurse Practitioner

## 2015-05-11 DIAGNOSIS — R928 Other abnormal and inconclusive findings on diagnostic imaging of breast: Secondary | ICD-10-CM

## 2016-01-09 ENCOUNTER — Encounter (INDEPENDENT_AMBULATORY_CARE_PROVIDER_SITE_OTHER): Payer: Self-pay | Admitting: Physician Assistant

## 2016-01-09 ENCOUNTER — Ambulatory Visit (INDEPENDENT_AMBULATORY_CARE_PROVIDER_SITE_OTHER): Payer: 59

## 2016-01-09 ENCOUNTER — Ambulatory Visit (INDEPENDENT_AMBULATORY_CARE_PROVIDER_SITE_OTHER): Payer: 59 | Admitting: Physician Assistant

## 2016-01-09 VITALS — Ht 66.5 in | Wt 190.0 lb

## 2016-01-09 DIAGNOSIS — G8929 Other chronic pain: Secondary | ICD-10-CM

## 2016-01-09 DIAGNOSIS — M545 Low back pain: Secondary | ICD-10-CM

## 2016-01-09 NOTE — Progress Notes (Signed)
Office Visit Note   Patient: Sherri Barrett           Date of Birth: July 28, 1965           MRN: [id number] Visit Date: 01/09/2016              Requested by: Delia Chimes, NP [address] [place], [place] [address] PCP: Delia Chimes, NP   Assessment & Plan: Visit Diagnoses:  1. Chronic low back pain, unspecified back pain laterality, with sciatica presence unspecified     Plan: Therapy for home exercise program core strengthening. Follow up in 6 weeks. She'll return sooner if she develops any declare symptoms down either leg or weakness in her lower extremities.  Follow-Up Instructions: Return in about 6 weeks (around 02/20/2016).   Orders:  Orders Placed This Encounter  Procedures  . XR Lumbar Spine 2-3 Views  . XR Lumb Spine Flex&Ext Only   No orders of the defined types were placed in this encounter.     Procedures: No procedures performed   Clinical Data: No additional findings.   Subjective: Chief Complaint  Patient presents with  . Lower Back - Pain    Patient states that she has chronic low back pain that is getting worse. She states that most of the pain is on her right side, but she is sore across the entire low back. She used to have episodes of pain shooting down her legs but that has not been the case lately. She does notice that any long periods of walking, ie grocery store, cause her to want to lean over and not stand up straight. She feels like she is "compressed". Pain into both hips.  She did go to chiropractor years ago with no relief. No known injury.    Review of Systems See HPI Objective: Vital Signs: Ht 5' 6.5" (1.689 m)   Wt 190 lb (86.2 kg)   BMI 30.21 kg/m   Physical Exam  Constitutional: She is oriented to person, place, and time. She appears well-developed and well-nourished. No distress.  Cardiovascular: Intact distal pulses.   Neurological: She is alert and oriented to person, place, and time.  Psychiatric: She has a  normal mood and affect.    Back Exam   Tenderness  The patient is experiencing tenderness in the lumbar.  Range of Motion  Extension: normal  Flexion: normal   Muscle Strength  Right Quadriceps:  5/5  Left Quadriceps:  5/5  Right Hamstrings:  5/5  Left Hamstrings:  5/5   Tests  Straight leg raise right: negative Straight leg raise left: negative  Reflexes  Patellar: normal Achilles: normal  Other  Toe Walk: normal Heel Walk: normal Sensation: normal Gait: normal       Specialty Comments:  No specialty comments available.  Imaging: No results found.   PMFS History: There are no active problems to display for this patient.  Past Medical History:  Diagnosis Date  . Hypertension     Family History  Problem Relation Age of Onset  . Breast cancer Mother   . Hypertension Mother   . Anuerysm Mother   . Lung cancer Father   . Heart disease Father   . Hypertension Father   . Colon cancer Neg Hx   . Esophageal cancer Neg Hx   . Rectal cancer Neg Hx   . Stomach cancer Neg Hx     Past Surgical History:  Procedure Laterality Date  . ABDOMINAL HYSTERECTOMY    . APPENDECTOMY    .  CESAREAN SECTION     x3  . WISDOM TOOTH EXTRACTION     Social History   Occupational History  . Not on file.   Social History Main Topics  . Smoking status: Never Smoker  . Smokeless tobacco: Never Used  . Alcohol use 0.6 oz/week    1 Glasses of wine per week     Comment: wine- occasionally  . Drug use: No  . Sexual activity: Not on file

## 2016-02-28 ENCOUNTER — Ambulatory Visit (INDEPENDENT_AMBULATORY_CARE_PROVIDER_SITE_OTHER): Payer: 59 | Admitting: Orthopaedic Surgery

## 2016-09-10 ENCOUNTER — Other Ambulatory Visit: Payer: Self-pay | Admitting: Family Medicine

## 2016-09-10 DIAGNOSIS — Z1231 Encounter for screening mammogram for malignant neoplasm of breast: Secondary | ICD-10-CM

## 2016-09-14 ENCOUNTER — Ambulatory Visit
Admission: RE | Admit: 2016-09-14 | Discharge: 2016-09-14 | Disposition: A | Discharge status: Home / Self Care | Payer: 59 | Source: Ambulatory Visit | Attending: Family Medicine | Admitting: Family Medicine

## 2016-09-14 DIAGNOSIS — Z1231 Encounter for screening mammogram for malignant neoplasm of breast: Secondary | ICD-10-CM

## 2016-10-10 ENCOUNTER — Encounter: Payer: Self-pay | Admitting: Primary Care

## 2016-10-10 ENCOUNTER — Ambulatory Visit (INDEPENDENT_AMBULATORY_CARE_PROVIDER_SITE_OTHER): Payer: 59 | Admitting: Primary Care

## 2016-10-10 DIAGNOSIS — I10 Essential (primary) hypertension: Secondary | ICD-10-CM

## 2016-10-10 DIAGNOSIS — E669 Obesity, unspecified: Secondary | ICD-10-CM

## 2016-10-10 MED ORDER — LISINOPRIL 20 MG PO TABS: 20.0000 mg | ORAL_TABLET | Freq: Every day | ORAL | 0 refills | Status: DC

## 2016-10-10 NOTE — Assessment & Plan Note (Signed)
Discussed diet, seems like dinner time is worst. Will have her monitor calories with phone App "My Fitness Pal". Also discussed to limit restaurant food, start looking up nutrition facts for all foods, even at restaurants. Continue regular exercise. Form completed.

## 2016-10-10 NOTE — Patient Instructions (Addendum)
Start Lisinopril 20 mg tablets for high blood pressure. Take 1 tablet by mouth every morning.  Check your blood pressure daily, around the same time of day, for the next 2 weeks.  Ensure that you have rested for 30 minutes prior to checking your blood pressure. Record your readings and bring them to your next visit.  Continue exercising. You should be getting 150 minutes of moderate intensity exercise weekly.  Increase consumption of vegetables, fruit, whole grains, lean protein.  Ensure you are consuming 64 ounces of water daily.  Schedule a follow up visit in 2-3 weeks for blood pressure recheck.  It was a pleasure to meet you today! Please don't hesitate to call me with any questions. Welcome to [facility]!   DASH Eating Plan DASH stands for "Dietary Approaches to Stop Hypertension." The DASH eating plan is a healthy eating plan that has been shown to reduce high blood pressure (hypertension). It may also reduce your risk for type 2 diabetes, heart disease, and stroke. The DASH eating plan may also help with weight loss. What are tips for following this plan? General guidelines  Avoid eating more than 2,300 mg (milligrams) of salt (sodium) a day. If you have hypertension, you may need to reduce your sodium intake to 1,500 mg a day.  Limit alcohol intake to no more than 1 drink a day for nonpregnant women and 2 drinks a day for men. One drink equals 12 oz of beer, 5 oz of wine, or 1 oz of hard liquor.  Work with your health care provider to maintain a healthy body weight or to lose weight. Ask what an ideal weight is for you.  Get at least 30 minutes of exercise that causes your heart to beat faster (aerobic exercise) most days of the week. Activities may include walking, swimming, or biking.  Work with your health care provider or diet and nutrition specialist (dietitian) to adjust your eating plan to your individual calorie needs. Reading food labels  Check food labels for the  amount of sodium per serving. Choose foods with less than 5 percent of the Daily Value of sodium. Generally, foods with less than 300 mg of sodium per serving fit into this eating plan.  To find whole grains, look for the word "whole" as the first word in the ingredient list. Shopping  Buy products labeled as "low-sodium" or "no salt added."  Buy fresh foods. Avoid canned foods and premade or frozen meals. Cooking  Avoid adding salt when cooking. Use salt-free seasonings or herbs instead of table salt or sea salt. Check with your health care provider or pharmacist before using salt substitutes.  Do not fry foods. Cook foods using healthy methods such as baking, boiling, grilling, and broiling instead.  Cook with heart-healthy oils, such as olive, canola, soybean, or sunflower oil. Meal planning   Eat a balanced diet that includes: ? 5 or more servings of fruits and vegetables each day. At each meal, try to fill half of your plate with fruits and vegetables. ? Up to 6-8 servings of whole grains each day. ? Less than 6 oz of lean meat, poultry, or fish each day. A 3-oz serving of meat is about the same size as a deck of cards. One egg equals 1 oz. ? 2 servings of low-fat dairy each day. ? A serving of nuts, seeds, or beans 5 times each week. ? Heart-healthy fats. Healthy fats called Omega-3 fatty acids are found in foods such as flaxseeds and coldwater  fish, like sardines, salmon, and mackerel.  Limit how much you eat of the following: ? Canned or prepackaged foods. ? Food that is high in trans fat, such as fried foods. ? Food that is high in saturated fat, such as fatty meat. ? Sweets, desserts, sugary drinks, and other foods with added sugar. ? Full-fat dairy products.  Do not salt foods before eating.  Try to eat at least 2 vegetarian meals each week.  Eat more home-cooked food and less restaurant, buffet, and fast food.  When eating at a restaurant, ask that your food be  prepared with less salt or no salt, if possible. What foods are recommended? The items listed may not be a complete list. Talk with your dietitian about what dietary choices are best for you. Grains Whole-grain or whole-wheat bread. Whole-grain or whole-wheat pasta. Brown rice. [place]. Bulgur. Whole-grain and low-sodium cereals. Pita bread. Low-fat, low-sodium crackers. Whole-wheat flour tortillas. Vegetables Fresh or frozen vegetables (raw, steamed, roasted, or grilled). Low-sodium or reduced-sodium tomato and vegetable juice. Low-sodium or reduced-sodium tomato sauce and tomato paste. Low-sodium or reduced-sodium canned vegetables. Fruits All fresh, dried, or frozen fruit. Canned fruit in natural juice (without added sugar). Meat and other protein foods Skinless chicken or [place]. Ground chicken or [place]. Pork with fat trimmed off. Fish and seafood. Egg whites. Dried beans, peas, or lentils. Unsalted nuts, nut butters, and seeds. Unsalted canned beans. Lean cuts of beef with fat trimmed off. Low-sodium, lean deli meat. Dairy Low-fat (1%) or fat-free (skim) milk. Fat-free, low-fat, or reduced-fat cheeses. Nonfat, low-sodium ricotta or cottage cheese. Low-fat or nonfat yogurt. Low-fat, low-sodium cheese. Fats and oils Soft margarine without trans fats. Vegetable oil. Low-fat, reduced-fat, or light mayonnaise and salad dressings (reduced-sodium). Canola, safflower, olive, soybean, and sunflower oils. Avocado. Seasoning and other foods Herbs. Spices. Seasoning mixes without salt. Unsalted popcorn and pretzels. Fat-free sweets. What foods are not recommended? The items listed may not be a complete list. Talk with your dietitian about what dietary choices are best for you. Grains Baked goods made with fat, such as croissants, muffins, or some breads. Dry pasta or rice meal packs. Vegetables Creamed or fried vegetables. Vegetables in a cheese sauce. Regular canned vegetables (not  low-sodium or reduced-sodium). Regular canned tomato sauce and paste (not low-sodium or reduced-sodium). Regular tomato and vegetable juice (not low-sodium or reduced-sodium). Angie Fava. Olives. Fruits Canned fruit in a light or heavy syrup. Fried fruit. Fruit in cream or butter sauce. Meat and other protein foods Fatty cuts of meat. Ribs. Fried meat. Berniece Salines. Sausage. Bologna and other processed lunch meats. Salami. Fatback. Hotdogs. Bratwurst. Salted nuts and seeds. Canned beans with added salt. Canned or smoked fish. Whole eggs or egg yolks. Chicken or [place] with skin. Dairy Whole or 2% milk, cream, and half-and-half. Whole or full-fat cream cheese. Whole-fat or sweetened yogurt. Full-fat cheese. Nondairy creamers. Whipped toppings. Processed cheese and cheese spreads. Fats and oils Butter. Stick margarine. Lard. Shortening. Ghee. Bacon fat. Tropical oils, such as coconut, palm kernel, or palm oil. Seasoning and other foods Salted popcorn and pretzels. Onion salt, garlic salt, seasoned salt, table salt, and sea salt. Worcestershire sauce. Tartar sauce. Barbecue sauce. Teriyaki sauce. Soy sauce, including reduced-sodium. Steak sauce. Canned and packaged gravies. Fish sauce. Oyster sauce. Cocktail sauce. Horseradish that you find on the shelf. Ketchup. Mustard. Meat flavorings and tenderizers. Bouillon cubes. Hot sauce and Tabasco sauce. Premade or packaged marinades. Premade or packaged taco seasonings. Relishes. Regular salad dressings. Where to find more  information:  National Heart, Lung, and Blood Institute: https://wilson-eaton.com/  American Heart Association: www.heart.org Summary  The DASH eating plan is a healthy eating plan that has been shown to reduce high blood pressure (hypertension). It may also reduce your risk for type 2 diabetes, heart disease, and stroke.  With the DASH eating plan, you should limit salt (sodium) intake to 2,300 mg a day. If you have hypertension, you may need to reduce  your sodium intake to 1,500 mg a day.  When on the DASH eating plan, aim to eat more fresh fruits and vegetables, whole grains, lean proteins, low-fat dairy, and heart-healthy fats.  Work with your health care provider or diet and nutrition specialist (dietitian) to adjust your eating plan to your individual calorie needs. This information is not intended to replace advice given to you by your health care provider. Make sure you discuss any questions you have with your health care provider. Document Released: 01/25/2011 Document Revised: 01/30/2016 Document Reviewed: 01/30/2016 Elsevier Interactive Patient Education  2017 [employer].

## 2016-10-10 NOTE — Assessment & Plan Note (Signed)
Above goal in the office today, also on recent Wellness visit. Given history of HTN with elevated readings, will treat. Rx for Lisinopril 20 mg sent to pharmacy. Will have her monitor readings and follow up in the office 2-3 weeks. Check BMP next visit.

## 2016-10-10 NOTE — Progress Notes (Signed)
Subjective:    Patient ID: Sherri Barrett, female    DOB: November 24, 1965, 51 y.o.   MRN: [id number]  HPI  Sherri Barrett is a 51 year old female who presents today to establish care and discuss the problems mentioned below. Will obtain old records.  1) Essential Hypertension: Previously managed on HCTZ several years ago. Stopped taking this medication as it caused side effects of dizziness. She's not taken any blood pressure medication since 2016. Her BP was checked at her Wellness Screening last week which was 161/90. Her BP in the office today is 152/84. She also endorses headaches. She denies chest pain, shortness of breath, visual changes.   2) Obesity: Presents with a form requesting a plan of action for BMI of 35. Changed her diet 2-3 months ago to a "healthier" regimen. She has been eating out more frequently since her children have moved out.   Diet currently consists of:  Breakfast: Fruit, protein bar Lunch: Salad, avocado fruit Dinner: Take out, [employer] (chicken, fish, potatoes, pizza).  Snacks: Occasionally, cereal, pretzels.  Desserts: 3 times weekly Beverages: Coffee, Coke Zero, water, wine 3-4 times weekly  Exercise: Exercises with a fitness video 5 days weekly for about 1 hour before work.    Review of Systems  Constitutional: Negative for fatigue.  Eyes: Negative for visual disturbance.  Respiratory: Negative for shortness of breath.   Cardiovascular: Negative for chest pain.  Neurological: Positive for headaches. Negative for dizziness.       Past Medical History:  Diagnosis Date  . Hypertension      Social History   Social History  . Marital status: Married    Spouse name: N/A  . Number of children: N/A  . Years of education: N/A   Occupational History  . Not on file.   Social History Main Topics  . Smoking status: Never Smoker  . Smokeless tobacco: Never Used  . Alcohol use 0.6 oz/week    1 Glasses of wine per week     Comment: wine-  occasionally  . Drug use: No  . Sexual activity: Not on file   Other Topics Concern  . Not on file   Social History Narrative   Married.   Three children.   Works at [employer].   Enjoys spending time at [employer], spending time with family.     Past Surgical History:  Procedure Laterality Date  . ABDOMINAL HYSTERECTOMY    . APPENDECTOMY    . BREAST BIOPSY Right    x2  . CESAREAN SECTION     x3  . WISDOM TOOTH EXTRACTION      Family History  Problem Relation Age of Onset  . Breast cancer Mother 27  . Hypertension Mother   . Anuerysm Mother   . Lung cancer Father   . Heart disease Father   . Hypertension Father   . Colon cancer Neg Hx   . Esophageal cancer Neg Hx   . Rectal cancer Neg Hx   . Stomach cancer Neg Hx     No Known Allergies  No current outpatient prescriptions on file prior to visit.   No current facility-administered medications on file prior to visit.     BP (!) 152/84   Pulse 68   Temp 98.2 F (36.8 C) (Oral)   Ht 5' 5.25" (1.657 m)   Wt 217 lb 12.8 oz (98.8 kg)   SpO2 98%   BMI 35.97 kg/m    Objective:   Physical Exam  Constitutional: She appears well-nourished.  Neck: Neck supple.  Cardiovascular: Normal rate and regular rhythm.   Pulmonary/Chest: Effort normal and breath sounds normal.  Skin: Skin is warm and dry.  Psychiatric: She has a normal mood and affect.          Assessment & Plan:

## 2016-10-26 ENCOUNTER — Ambulatory Visit (INDEPENDENT_AMBULATORY_CARE_PROVIDER_SITE_OTHER): Payer: 59 | Admitting: Primary Care

## 2016-10-26 ENCOUNTER — Encounter: Payer: Self-pay | Admitting: Primary Care

## 2016-10-26 VITALS — BP 124/78 | HR 70 | Temp 98.4°F | Ht 65.25 in | Wt 215.8 lb

## 2016-10-26 DIAGNOSIS — I10 Essential (primary) hypertension: Secondary | ICD-10-CM | POA: Diagnosis not present

## 2016-10-26 DIAGNOSIS — Z1159 Encounter for screening for other viral diseases: Secondary | ICD-10-CM | POA: Diagnosis not present

## 2016-10-26 MED ORDER — LISINOPRIL 20 MG PO TABS: 20.0000 mg | ORAL_TABLET | Freq: Every day | ORAL | 3 refills | Status: DC

## 2016-10-26 NOTE — Progress Notes (Signed)
   Subjective:    Patient ID: Sherri Barrett, female    DOB: [birth year], 51 y.o.   MRN: [id number]  HPI  Sherri Barrett is a 51 year old female who presents today for follow up of hypertension. She is currently managed on lisinopril 20 mg for which was initiated several weeks ago for uncontrolled blood pressures.  Her BP in office today is 124/78. She's checking her BP at home and is running 120's-130's/70's80's. She denies dizziness, chest pain, shortness of breath, cough.  Review of Systems  Eyes: Negative for visual disturbance.  Respiratory: Negative for shortness of breath.   Cardiovascular: Negative for chest pain.  Neurological: Negative for dizziness and headaches.       Past Medical History:  Diagnosis Date  . Hypertension      Social History   Social History  . Marital status: Married    Spouse name: N/A  . Number of children: N/A  . Years of education: N/A   Occupational History  . Not on file.   Social History Main Topics  . Smoking status: Never Smoker  . Smokeless tobacco: Never Used  . Alcohol use 0.6 oz/week    1 Glasses of wine per week     Comment: wine- occasionally  . Drug use: No  . Sexual activity: Not on file   Other Topics Concern  . Not on file   Social History Narrative   Married.   Three children.   Works at [employer].   Enjoys spending time at [employer], spending time with family.     Past Surgical History:  Procedure Laterality Date  . ABDOMINAL HYSTERECTOMY    . APPENDECTOMY    . BREAST BIOPSY Right    x2  . CESAREAN SECTION     x3  . WISDOM TOOTH EXTRACTION      Family History  Problem Relation Age of Onset  . Breast cancer Mother 63  . Hypertension Mother   . Anuerysm Mother   . Lung cancer Father   . Heart disease Father   . Hypertension Father   . Colon cancer Neg Hx   . Esophageal cancer Neg Hx   . Rectal cancer Neg Hx   . Stomach cancer Neg Hx     No Known Allergies  No current outpatient prescriptions on file  prior to visit.   No current facility-administered medications on file prior to visit.     BP 124/78   Pulse 70   Temp 98.4 F (36.9 C) (Oral)   Ht 5' 5.25" (1.657 m)   Wt 215 lb 12.8 oz (97.9 kg)   SpO2 97%   BMI 35.64 kg/m    Objective:   Physical Exam  Constitutional: She appears well-nourished.  Neck: Neck supple.  Cardiovascular: Normal rate and regular rhythm.   Pulmonary/Chest: Effort normal and breath sounds normal.  Skin: Skin is warm and dry.          Assessment & Plan:

## 2016-10-26 NOTE — Patient Instructions (Signed)
Complete lab work prior to leaving today. I will notify you of your results once received.   Continue lisinopril 20 mg tablets for high blood pressure.  It was a pleasure to see you today!

## 2016-10-26 NOTE — Assessment & Plan Note (Signed)
Stable in the office today, continue lisinopril 20 mg.  BMP pending. 

## 2016-10-27 LAB — BASIC METABOLIC PANEL
BUN/Creatinine Ratio: 18 (ref 9–23)
BUN: 16 mg/dL (ref 6–24)
CHLORIDE: 104 mmol/L (ref 96–106)
CO2: 25 mmol/L (ref 20–29)
Calcium: 9.6 mg/dL (ref 8.7–10.2)
Creatinine, Ser: 0.87 mg/dL (ref 0.57–1.00)
GFR calc Af Amer: 88 mL/min/{1.73_m2} (ref >59)
GFR calc non Af Amer: 76 mL/min/{1.73_m2} (ref >59)
GLUCOSE: 85 mg/dL (ref 65–99)
POTASSIUM: 4.8 mmol/L (ref 3.5–5.2)
SODIUM: 144 mmol/L (ref 134–144)

## 2016-10-27 LAB — HEPATITIS C ANTIBODY: Hep C Virus Ab: <0.1 s/co ratio (ref 0.0–0.9)

## 2017-09-05 ENCOUNTER — Other Ambulatory Visit: Payer: Self-pay | Admitting: Primary Care

## 2017-09-05 DIAGNOSIS — Z1231 Encounter for screening mammogram for malignant neoplasm of breast: Secondary | ICD-10-CM

## 2017-09-26 ENCOUNTER — Ambulatory Visit
Admission: RE | Admit: 2017-09-26 | Discharge: 2017-09-26 | Disposition: A | Discharge status: Home / Self Care | Payer: Managed Care, Other (non HMO) | Source: Ambulatory Visit | Attending: Primary Care | Admitting: Primary Care

## 2017-09-26 DIAGNOSIS — Z1231 Encounter for screening mammogram for malignant neoplasm of breast: Secondary | ICD-10-CM

## 2017-10-30 ENCOUNTER — Other Ambulatory Visit: Payer: Self-pay | Admitting: Primary Care

## 2017-10-30 DIAGNOSIS — I10 Essential (primary) hypertension: Secondary | ICD-10-CM

## 2018-01-23 ENCOUNTER — Other Ambulatory Visit: Payer: Self-pay | Admitting: Primary Care

## 2018-01-23 DIAGNOSIS — I10 Essential (primary) hypertension: Secondary | ICD-10-CM

## 2018-02-08 ENCOUNTER — Other Ambulatory Visit: Payer: Self-pay | Admitting: Primary Care

## 2018-02-08 DIAGNOSIS — I10 Essential (primary) hypertension: Secondary | ICD-10-CM

## 2018-02-10 NOTE — Telephone Encounter (Signed)
Please notify patient that she is overdue for a follow-up visit and will need to be seen in the office for further refills.  I will send a 30-day supply of her medication to her pharmacy, does she prefer [facility] or a local pharmacy?

## 2018-02-10 NOTE — Telephone Encounter (Signed)
Last prescribed on 10/31/2017  Last office visit on 10/26/2016. No future appointment.

## 2018-02-10 NOTE — Telephone Encounter (Signed)
Message left for patient to return my call.  

## 2018-02-17 NOTE — Telephone Encounter (Signed)
Message left for patient to return my call.  

## 2018-02-20 NOTE — Telephone Encounter (Signed)
Message left for patient to return my call.  Also send patient a message through [employer]

## 2018-02-28 ENCOUNTER — Encounter: Payer: Self-pay | Admitting: Primary Care

## 2018-02-28 ENCOUNTER — Ambulatory Visit: Payer: Managed Care, Other (non HMO) | Admitting: Primary Care

## 2018-02-28 VITALS — BP 114/72 | HR 60 | Temp 98.0°F | Ht 65.25 in | Wt 236.5 lb

## 2018-02-28 DIAGNOSIS — Z23 Encounter for immunization: Secondary | ICD-10-CM

## 2018-02-28 DIAGNOSIS — I10 Essential (primary) hypertension: Secondary | ICD-10-CM

## 2018-02-28 NOTE — Assessment & Plan Note (Signed)
Stable on lisinopril 20 mg. Discussed to work on daily compliance. CMP pending. She will notify when needing refills.

## 2018-02-28 NOTE — Addendum Note (Signed)
Addended by: Jacqualin Combes on: 02/28/2018 12:12 PM   Modules accepted: Orders

## 2018-02-28 NOTE — Patient Instructions (Signed)
Continue lisinopril 20 mg daily for high blood pressure.  Stop by the lab prior to leaving today. I will notify you of your results once received.   You were provided with a tetanus vaccination which will cover you for 10 years.   It was a pleasure to see you today!

## 2018-02-28 NOTE — Progress Notes (Signed)
Subjective:    Patient ID: Sherri Barrett, female    DOB: [birth year], 53 y.o.   MRN: [id number]  HPI  Sherri Barrett is a 53 year old female who presents today for follow up of hypertension. She is also due for her tetanus and influenza vaccinations.   She was last evaluated in our office in September 2018. She is taking her lisinopril most everyday, will skip sometimes during the weekend as she forgets. She denies chest pain, dizziness, shortness of breath.   BP Readings from Last 3 Encounters:  02/28/18 114/72  10/26/16 124/78  10/10/16 (!) 152/84     Review of Systems  Eyes: Negative for visual disturbance.  Respiratory: Negative for cough and shortness of breath.   Cardiovascular: Negative for chest pain.  Neurological: Negative for dizziness and headaches.       Past Medical History:  Diagnosis Date  . Hypertension      Social History   Socioeconomic History  . Marital status: Married    Spouse name: Not on file  . Number of children: Not on file  . Years of education: Not on file  . Highest education level: Not on file  Occupational History  . Not on file  Social Needs  . Financial resource strain: Not on file  . Food insecurity:    Worry: Not on file    Inability: Not on file  . Transportation needs:    Medical: Not on file    Non-medical: Not on file  Tobacco Use  . Smoking status: Never Smoker  . Smokeless tobacco: Never Used  Substance and Sexual Activity  . Alcohol use: Yes    Alcohol/week: 1.0 standard drinks    Types: 1 Glasses of wine per week    Comment: wine- occasionally  . Drug use: No  . Sexual activity: Not on file  Lifestyle  . Physical activity:    Days per week: Not on file    Minutes per session: Not on file  . Stress: Not on file  Relationships  . Social connections:    Talks on phone: Not on file    Gets together: Not on file    Attends religious service: Not on file    Active member of club or organization: Not on file   Attends meetings of clubs or organizations: Not on file    Relationship status: Not on file  . Intimate partner violence:    Fear of current or ex partner: Not on file    Emotionally abused: Not on file    Physically abused: Not on file    Forced sexual activity: Not on file  Other Topics Concern  . Not on file  Social History Narrative   Married.   Three children.   Works at [employer].   Enjoys spending time at [employer], spending time with family.     Past Surgical History:  Procedure Laterality Date  . ABDOMINAL HYSTERECTOMY    . APPENDECTOMY    . BREAST BIOPSY Right    x2  . CESAREAN SECTION     x3  . WISDOM TOOTH EXTRACTION      Family History  Problem Relation Age of Onset  . Breast cancer Mother 43  . Hypertension Mother   . Anuerysm Mother   . Lung cancer Father   . Heart disease Father   . Hypertension Father   . Colon cancer Neg Hx   . Esophageal cancer Neg Hx   . Rectal cancer Neg  Hx   . Stomach cancer Neg Hx     No Known Allergies  Current Outpatient Medications on File Prior to Visit  Medication Sig Dispense Refill  . lisinopril (PRINIVIL,ZESTRIL) 20 MG tablet Take 1 tablet (20 mg total) by mouth daily. Patient Needs a follow up appointment before any additional refills needed. 90 tablet 0   No current facility-administered medications on file prior to visit.     BP 114/72   Pulse 60   Temp 98 F (36.7 C) (Oral)   Ht 5' 5.25" (1.657 m)   Wt 236 lb 8 oz (107.3 kg)   SpO2 98%   BMI 39.05 kg/m    Objective:   Physical Exam  Constitutional: She appears well-nourished.  Neck: Neck supple.  Cardiovascular: Normal rate and regular rhythm.  Respiratory: Effort normal and breath sounds normal.  Skin: Skin is warm and dry.           Assessment & Plan:

## 2018-03-01 LAB — COMPREHENSIVE METABOLIC PANEL
A/G RATIO: 2.2 (ref 1.2–2.2)
ALBUMIN: 4.2 g/dL (ref 3.5–5.5)
ALT: 18 IU/L (ref 0–32)
AST: 19 IU/L (ref 0–40)
Alkaline Phosphatase: 81 IU/L (ref 39–117)
BUN/Creatinine Ratio: 17 (ref 9–23)
BUN: 14 mg/dL (ref 6–24)
Bilirubin Total: 0.5 mg/dL (ref 0.0–1.2)
CALCIUM: 9.4 mg/dL (ref 8.7–10.2)
CO2: 23 mmol/L (ref 20–29)
Chloride: 102 mmol/L (ref 96–106)
Creatinine, Ser: 0.83 mg/dL (ref 0.57–1.00)
GFR, EST AFRICAN AMERICAN: 92 mL/min/{1.73_m2} (ref >59)
GFR, EST NON AFRICAN AMERICAN: 80 mL/min/{1.73_m2} (ref >59)
GLOBULIN, TOTAL: 1.9 g/dL (ref 1.5–4.5)
Glucose: 89 mg/dL (ref 65–99)
POTASSIUM: 4.7 mmol/L (ref 3.5–5.2)
Sodium: 140 mmol/L (ref 134–144)
Total Protein: 6.1 g/dL (ref 6.0–8.5)

## 2018-05-06 ENCOUNTER — Other Ambulatory Visit: Payer: Self-pay

## 2018-05-06 DIAGNOSIS — I10 Essential (primary) hypertension: Secondary | ICD-10-CM

## 2018-05-06 MED ORDER — LISINOPRIL 20 MG PO TABS: 20.0000 mg | ORAL_TABLET | Freq: Every day | ORAL | 1 refills | Status: DC

## 2018-09-23 ENCOUNTER — Other Ambulatory Visit: Payer: Self-pay | Admitting: Primary Care

## 2018-09-23 DIAGNOSIS — I10 Essential (primary) hypertension: Secondary | ICD-10-CM

## 2018-10-28 ENCOUNTER — Other Ambulatory Visit: Payer: Self-pay | Admitting: Primary Care

## 2018-10-28 DIAGNOSIS — Z1231 Encounter for screening mammogram for malignant neoplasm of breast: Secondary | ICD-10-CM

## 2018-12-10 ENCOUNTER — Ambulatory Visit
Admission: RE | Admit: 2018-12-10 | Discharge: 2018-12-10 | Disposition: A | Discharge status: Home / Self Care | Payer: Managed Care, Other (non HMO) | Source: Ambulatory Visit | Attending: Primary Care | Admitting: Primary Care

## 2018-12-10 ENCOUNTER — Other Ambulatory Visit: Payer: Self-pay

## 2018-12-10 DIAGNOSIS — Z1231 Encounter for screening mammogram for malignant neoplasm of breast: Secondary | ICD-10-CM

## 2019-03-04 ENCOUNTER — Other Ambulatory Visit: Payer: Self-pay | Admitting: Primary Care

## 2019-03-04 DIAGNOSIS — I10 Essential (primary) hypertension: Secondary | ICD-10-CM

## 2019-03-06 NOTE — Telephone Encounter (Signed)
Pt hasn't been seen in over a year and no future appts., please advise  

## 2019-03-06 NOTE — Telephone Encounter (Signed)
Please kindly notify patient that she is over due for CPE/follow up. I will provide her with a 30 day supply of her medication until she can get in to be seen. Please schedule.

## 2019-07-24 ENCOUNTER — Other Ambulatory Visit: Payer: Self-pay | Admitting: Primary Care

## 2019-07-24 DIAGNOSIS — I10 Essential (primary) hypertension: Secondary | ICD-10-CM

## 2019-07-24 DIAGNOSIS — Z114 Encounter for screening for human immunodeficiency virus [HIV]: Secondary | ICD-10-CM

## 2019-07-28 ENCOUNTER — Other Ambulatory Visit (INDEPENDENT_AMBULATORY_CARE_PROVIDER_SITE_OTHER): Payer: Managed Care, Other (non HMO)

## 2019-07-28 ENCOUNTER — Other Ambulatory Visit: Payer: Self-pay

## 2019-07-28 DIAGNOSIS — I10 Essential (primary) hypertension: Secondary | ICD-10-CM

## 2019-07-28 DIAGNOSIS — Z114 Encounter for screening for human immunodeficiency virus [HIV]: Secondary | ICD-10-CM

## 2019-07-29 LAB — CBC
Hematocrit: 44.2 % (ref 34.0–46.6)
Hemoglobin: 14.5 g/dL (ref 11.1–15.9)
MCH: 31 pg (ref 26.6–33.0)
MCHC: 32.8 g/dL (ref 31.5–35.7)
MCV: 95 fL (ref 79–97)
Platelets: 194 10*3/uL (ref 150–450)
RBC: 4.67 x10E6/uL (ref 3.77–5.28)
RDW: 12.5 % (ref 11.7–15.4)
WBC: 5.3 10*3/uL (ref 3.4–10.8)

## 2019-07-29 LAB — LIPID PANEL
Chol/HDL Ratio: 2.2 ratio (ref 0.0–4.4)
Cholesterol, Total: 231 mg/dL — ABNORMAL HIGH (ref 100–199)
HDL: 103 mg/dL (ref >39)
LDL Chol Calc (NIH): 117 mg/dL — ABNORMAL HIGH (ref 0–99)
Triglycerides: 67 mg/dL (ref 0–149)
VLDL Cholesterol Cal: 11 mg/dL (ref 5–40)

## 2019-07-29 LAB — COMPREHENSIVE METABOLIC PANEL
ALT: 18 IU/L (ref 0–32)
AST: 19 IU/L (ref 0–40)
Albumin/Globulin Ratio: 1.9 (ref 1.2–2.2)
Albumin: 4.2 g/dL (ref 3.8–4.9)
Alkaline Phosphatase: 85 IU/L (ref 48–121)
BUN/Creatinine Ratio: 19 (ref 9–23)
BUN: 15 mg/dL (ref 6–24)
Bilirubin Total: 0.3 mg/dL (ref 0.0–1.2)
CO2: 23 mmol/L (ref 20–29)
Calcium: 9.2 mg/dL (ref 8.7–10.2)
Chloride: 105 mmol/L (ref 96–106)
Creatinine, Ser: 0.8 mg/dL (ref 0.57–1.00)
GFR calc Af Amer: 95 mL/min/{1.73_m2} (ref >59)
GFR calc non Af Amer: 83 mL/min/{1.73_m2} (ref >59)
Globulin, Total: 2.2 g/dL (ref 1.5–4.5)
Glucose: 93 mg/dL (ref 65–99)
Potassium: 4.6 mmol/L (ref 3.5–5.2)
Sodium: 140 mmol/L (ref 134–144)
Total Protein: 6.4 g/dL (ref 6.0–8.5)

## 2019-07-29 LAB — HIV ANTIBODY (ROUTINE TESTING W REFLEX): HIV Screen 4th Generation wRfx: NONREACTIVE

## 2019-08-04 ENCOUNTER — Other Ambulatory Visit: Payer: Self-pay

## 2019-08-04 ENCOUNTER — Ambulatory Visit (INDEPENDENT_AMBULATORY_CARE_PROVIDER_SITE_OTHER): Payer: Managed Care, Other (non HMO) | Admitting: Primary Care

## 2019-08-04 ENCOUNTER — Encounter: Payer: Self-pay | Admitting: Primary Care

## 2019-08-04 VITALS — BP 124/82 | HR 80 | Temp 96.7°F | Ht 65.0 in | Wt 253.0 lb

## 2019-08-04 DIAGNOSIS — I10 Essential (primary) hypertension: Secondary | ICD-10-CM

## 2019-08-04 DIAGNOSIS — Z23 Encounter for immunization: Secondary | ICD-10-CM

## 2019-08-04 DIAGNOSIS — Z0000 Encounter for general adult medical examination without abnormal findings: Secondary | ICD-10-CM

## 2019-08-04 DIAGNOSIS — D229 Melanocytic nevi, unspecified: Secondary | ICD-10-CM | POA: Insufficient documentation

## 2019-08-04 MED ORDER — LISINOPRIL 20 MG PO TABS: 20.0000 mg | ORAL_TABLET | Freq: Every day | ORAL | 0 refills | Status: DC

## 2019-08-04 NOTE — Progress Notes (Signed)
Subjective:    Patient ID: Sherri Barrett, female    DOB: 1965-07-07, 54 y.o.   MRN: [id number]  HPI  This visit occurred during the SARS-CoV-2 public health emergency.  Safety protocols were in place, including screening questions prior to the visit, additional usage of staff PPE, and extensive cleaning of exam room while observing appropriate contact time as indicated for disinfecting solutions.   Sherri Barrett is a 54 year old female who presents today for complete physical.  Immunizations: -Tetanus: Completed in 2020 -Influenza: Completed last season  -Shingles: Never completed -Covid-19: Completed series   Diet: She endorses a poor diet during Covid-19 pandemic  Exercise: No regular exercise   Eye exam: No recent exam Dental exam: Completes semi-annually  Pap Smear: Hysterectomy  Mammogram: Completed in October 2020 Colonoscopy: Completed in 2016, due in 2026 Hep C Screen: Negative   BP Readings from Last 3 Encounters:  08/04/19 124/82  02/28/18 114/72  10/26/16 124/78   Wt Readings from Last 3 Encounters:  08/04/19 253 lb (114.8 kg)  02/28/18 236 lb 8 oz (107.3 kg)  10/26/16 215 lb 12.8 oz (97.9 kg)     Review of Systems  Constitutional: Negative for unexpected weight change.  HENT: Negative for rhinorrhea.   Respiratory: Negative for cough and shortness of breath.   Cardiovascular: Negative for chest pain.  Gastrointestinal: Negative for constipation and diarrhea.  Genitourinary: Negative for difficulty urinating and menstrual problem.  Musculoskeletal: Negative for arthralgias and myalgias.  Skin: Negative for rash.       Nevus under left mid eye lid, growing  Allergic/Immunologic: Negative for environmental allergies.  Neurological: Negative for dizziness, numbness and headaches.  Psychiatric/Behavioral: The patient is not nervous/anxious.        Past Medical History:  Diagnosis Date  . Hypertension      Social History   Socioeconomic History  .  Marital status: Married    Spouse name: Not on file  . Number of children: Not on file  . Years of education: Not on file  . Highest education level: Not on file  Occupational History  . Not on file  Tobacco Use  . Smoking status: Never Smoker  . Smokeless tobacco: Never Used  Substance and Sexual Activity  . Alcohol use: Yes    Alcohol/week: 1.0 standard drink    Types: 1 Glasses of wine per week    Comment: wine- occasionally  . Drug use: No  . Sexual activity: Not on file  Other Topics Concern  . Not on file  Social History Narrative   Married.   Three children.   Works at [employer].   Enjoys spending time at [employer], spending time with family.    Social Determinants of Health   Financial Resource Strain:   . Difficulty of Paying Living Expenses:   Food Insecurity:   . Worried About Charity fundraiser in the Last Year:   . Arboriculturist in the Last Year:   Transportation Needs:   . Film/video editor (Medical):   Marland Kitchen Lack of Transportation (Non-Medical):   Physical Activity:   . Days of Exercise per Week:   . Minutes of Exercise per Session:   Stress:   . Feeling of Stress :   Social Connections:   . Frequency of Communication with Friends and Family:   . Frequency of Social Gatherings with Friends and Family:   . Attends Religious Services:   . Active Member of Clubs or Organizations:   .  Attends Archivist Meetings:   Marland Kitchen Marital Status:   Intimate Partner Violence:   . Fear of Current or Ex-Partner:   . Emotionally Abused:   Marland Kitchen Physically Abused:   . Sexually Abused:     Past Surgical History:  Procedure Laterality Date  . ABDOMINAL HYSTERECTOMY    . APPENDECTOMY    . BREAST BIOPSY Right    x2  . CESAREAN SECTION     x3  . WISDOM TOOTH EXTRACTION      Family History  Problem Relation Age of Onset  . Breast cancer Mother 37  . Hypertension Mother   . Anuerysm Mother   . Lung cancer Father   . Heart disease Father   . Hypertension  Father   . Colon cancer Neg Hx   . Esophageal cancer Neg Hx   . Rectal cancer Neg Hx   . Stomach cancer Neg Hx     No Known Allergies  Current Outpatient Medications on File Prior to Visit  Medication Sig Dispense Refill  . lisinopril (ZESTRIL) 20 MG tablet TAKE 1 TABLET BY MOUTH  DAILY FOR BLOOD PRESSURE 30 tablet 0   No current facility-administered medications on file prior to visit.    BP 124/82   Pulse 80   Temp (!) 96.7 F (35.9 C) (Temporal)   Ht 5\' 5"  (1.651 m)   Wt 253 lb (114.8 kg)   SpO2 98%   BMI 42.10 kg/m    Objective:   Physical Exam  Constitutional: She is oriented to person, place, and time.  HENT:  Right Ear: Tympanic membrane and ear canal normal.  Left Ear: Tympanic membrane and ear canal normal.  Eyes: Pupils are equal, round, and reactive to light.  Cardiovascular: Normal rate and regular rhythm.  Respiratory: Effort normal and breath sounds normal.  GI: Soft. Bowel sounds are normal. There is no abdominal tenderness.  Musculoskeletal:        General: Normal range of motion.     Cervical back: Neck supple.  Neurological: She is alert and oriented to person, place, and time. No cranial nerve deficit.  Reflex Scores:      Patellar reflexes are 2+ on the right side and 2+ on the left side. Skin: Skin is warm and dry.  0.25 cm, raised, dark brown nevus under left mid eye lid.   Psychiatric: Mood normal.           Assessment & Plan:

## 2019-08-04 NOTE — Assessment & Plan Note (Addendum)
Currently taking five days weekly for the last month in an attempt to stretch out her remaining pills last. BP in the office stable today, continue lisinopril 20 mg. Encouraged daily compliance.   Refills sent to pharmacy.

## 2019-08-04 NOTE — Addendum Note (Signed)
Addended by: Loreen Freud on: 08/04/2019 10:43 AM   Modules accepted: Orders

## 2019-08-04 NOTE — Assessment & Plan Note (Signed)
Under mid left eye lid, appears benign, but does appear to be bothersome. She has a dermatologist and will call their office for an appointment.

## 2019-08-04 NOTE — Assessment & Plan Note (Signed)
Shingrix vaccine provided today for her first dose. Other vaccines UTD. Mammogram UTD, due again in October 2021. Colonoscopy UTD, due in 2026. Discussed the importance of a healthy diet and regular exercise in order for weight loss, and to reduce the risk of any potential medical problems.  Exam today unremarkable. Labs reviewed.

## 2019-08-04 NOTE — Patient Instructions (Signed)
Start exercising. You should be getting 150 minutes of moderate intensity exercise weekly.  It's important to improve your diet by reducing consumption of fast food, fried food, processed snack foods, sugary drinks. Increase consumption of fresh vegetables and fruits, whole grains, water.  Ensure you are drinking 64 ounces of water daily.  Schedule a nurse visit for 2-6 months for your second Shingles vaccine.  It was a pleasure to see you today!   Preventive Care 20-61 Years Old, Female Preventive care refers to visits with your health care provider and lifestyle choices that can promote health and wellness. This includes:  A yearly physical exam. This may also be called an annual well check.  Regular dental visits and eye exams.  Immunizations.  Screening for certain conditions.  Healthy lifestyle choices, such as eating a healthy diet, getting regular exercise, not using drugs or products that contain nicotine and tobacco, and limiting alcohol use. What can I expect for my preventive care visit? Physical exam Your health care provider will check your:  Height and weight. This may be used to calculate body mass index (BMI), which tells if you are at a healthy weight.  Heart rate and blood pressure.  Skin for abnormal spots. Counseling Your health care provider may ask you questions about your:  Alcohol, tobacco, and drug use.  Emotional well-being.  Home and relationship well-being.  Sexual activity.  Eating habits.  Work and work Statistician.  Method of birth control.  Menstrual cycle.  Pregnancy history. What immunizations do I need?  Influenza (flu) vaccine  This is recommended every year. Tetanus, diphtheria, and pertussis (Tdap) vaccine  You may need a Td booster every 10 years. Varicella (chickenpox) vaccine  You may need this if you have not been vaccinated. Zoster (shingles) vaccine  You may need this after age 33. Measles, mumps, and  rubella (MMR) vaccine  You may need at least one dose of MMR if you were born in 1957 or later. You may also need a second dose. Pneumococcal conjugate (PCV13) vaccine  You may need this if you have certain conditions and were not previously vaccinated. Pneumococcal polysaccharide (PPSV23) vaccine  You may need one or two doses if you smoke cigarettes or if you have certain conditions. Meningococcal conjugate (MenACWY) vaccine  You may need this if you have certain conditions. Hepatitis A vaccine  You may need this if you have certain conditions or if you travel or work in places where you may be exposed to hepatitis A. Hepatitis B vaccine  You may need this if you have certain conditions or if you travel or work in places where you may be exposed to hepatitis B. Haemophilus influenzae type b (Hib) vaccine  You may need this if you have certain conditions. Human papillomavirus (HPV) vaccine  If recommended by your health care provider, you may need three doses over 6 months. You may receive vaccines as individual doses or as more than one vaccine together in one shot (combination vaccines). Talk with your health care provider about the risks and benefits of combination vaccines. What tests do I need? Blood tests  Lipid and cholesterol levels. These may be checked every 5 years, or more frequently if you are over 60 years old.  Hepatitis C test.  Hepatitis B test. Screening  Lung cancer screening. You may have this screening every year starting at age 49 if you have a 30-pack-year history of smoking and currently smoke or have quit within the past 15 years.  Colorectal cancer screening. All adults should have this screening starting at age 37 and continuing until age 41. Your health care provider may recommend screening at age 72 if you are at increased risk. You will have tests every 1-10 years, depending on your results and the type of screening test.  Diabetes screening. This  is done by checking your blood sugar (glucose) after you have not eaten for a while (fasting). You may have this done every 1-3 years.  Mammogram. This may be done every 1-2 years. Talk with your health care provider about when you should start having regular mammograms. This may depend on whether you have a family history of breast cancer.  BRCA-related cancer screening. This may be done if you have a family history of breast, ovarian, tubal, or peritoneal cancers.  Pelvic exam and Pap test. This may be done every 3 years starting at age 47. Starting at age 52, this may be done every 5 years if you have a Pap test in combination with an HPV test. Other tests  Sexually transmitted disease (STD) testing.  Bone density scan. This is done to screen for osteoporosis. You may have this scan if you are at high risk for osteoporosis. Follow these instructions at home: Eating and drinking  Eat a diet that includes fresh fruits and vegetables, whole grains, lean protein, and low-fat dairy.  Take vitamin and mineral supplements as recommended by your health care provider.  Do not drink alcohol if: ? Your health care provider tells you not to drink. ? You are pregnant, may be pregnant, or are planning to become pregnant.  If you drink alcohol: ? Limit how much you have to 0-1 drink a day. ? Be aware of how much alcohol is in your drink. In the [place] one drink equals one 12 oz bottle of beer (355 mL), one 5 oz glass of wine (148 mL), or one 1 oz glass of hard liquor (44 mL). Lifestyle  Take daily care of your teeth and gums.  Stay active. Exercise for at least 30 minutes on 5 or more days each week.  Do not use any products that contain nicotine or tobacco, such as cigarettes, e-cigarettes, and chewing tobacco. If you need help quitting, ask your health care provider.  If you are sexually active, practice safe sex. Use a condom or other form of birth control (contraception) in order to prevent  pregnancy and STIs (sexually transmitted infections).  If told by your health care provider, take low-dose aspirin daily starting at age 53. What's next?  Visit your health care provider once a year for a well check visit.  Ask your health care provider how often you should have your eyes and teeth checked.  Stay up to date on all vaccines. This information is not intended to replace advice given to you by your health care provider. Make sure you discuss any questions you have with your health care provider. Document Revised: 10/17/2017 Document Reviewed: 10/17/2017 Elsevier Patient Education  2020 [employer].

## 2019-09-02 ENCOUNTER — Other Ambulatory Visit: Payer: Self-pay

## 2019-09-02 DIAGNOSIS — I10 Essential (primary) hypertension: Secondary | ICD-10-CM

## 2019-09-02 MED ORDER — LISINOPRIL 20 MG PO TABS: 20.0000 mg | ORAL_TABLET | Freq: Every day | ORAL | 2 refills | Status: DC

## 2019-10-06 ENCOUNTER — Ambulatory Visit (INDEPENDENT_AMBULATORY_CARE_PROVIDER_SITE_OTHER): Payer: Managed Care, Other (non HMO)

## 2019-10-06 ENCOUNTER — Other Ambulatory Visit: Payer: Self-pay

## 2019-10-06 DIAGNOSIS — Z23 Encounter for immunization: Secondary | ICD-10-CM | POA: Diagnosis not present

## 2020-01-01 ENCOUNTER — Other Ambulatory Visit: Payer: Self-pay | Admitting: Primary Care

## 2020-01-01 DIAGNOSIS — Z1231 Encounter for screening mammogram for malignant neoplasm of breast: Secondary | ICD-10-CM

## 2020-01-13 ENCOUNTER — Ambulatory Visit
Admission: RE | Admit: 2020-01-13 | Discharge: 2020-01-13 | Disposition: A | Discharge status: Home / Self Care | Payer: Managed Care, Other (non HMO) | Source: Ambulatory Visit

## 2020-01-13 ENCOUNTER — Other Ambulatory Visit: Payer: Self-pay

## 2020-01-13 DIAGNOSIS — Z1231 Encounter for screening mammogram for malignant neoplasm of breast: Secondary | ICD-10-CM

## 2020-04-12 ENCOUNTER — Other Ambulatory Visit: Payer: Self-pay | Admitting: Primary Care

## 2020-04-12 DIAGNOSIS — I10 Essential (primary) hypertension: Secondary | ICD-10-CM

## 2020-04-13 NOTE — Telephone Encounter (Signed)
Needs office visit in June 2022 for CPE for further refills. Please schedule.

## 2020-04-18 NOTE — Telephone Encounter (Signed)
Called patient to schedule cpe. LVM to call back.  

## 2020-04-21 NOTE — Telephone Encounter (Signed)
Called patient to schedule cpe. Letter sent. LVM

## 2020-07-05 ENCOUNTER — Other Ambulatory Visit: Payer: Self-pay | Admitting: Primary Care

## 2020-07-05 DIAGNOSIS — I10 Essential (primary) hypertension: Secondary | ICD-10-CM

## 2020-07-06 NOTE — Telephone Encounter (Signed)
Patient is due for CPE/follow up, this will be required prior to any further refills.  Please schedule.   

## 2020-07-07 NOTE — Telephone Encounter (Signed)
Called patient reviewed all information and repeated back to me. Will call if any questions.  ? ?

## 2020-07-08 NOTE — Telephone Encounter (Signed)
Left message to return call to our office.  

## 2020-08-18 ENCOUNTER — Other Ambulatory Visit: Payer: Self-pay | Admitting: Primary Care

## 2020-08-18 DIAGNOSIS — I10 Essential (primary) hypertension: Secondary | ICD-10-CM

## 2020-09-04 ENCOUNTER — Other Ambulatory Visit: Payer: Self-pay | Admitting: Primary Care

## 2020-09-04 DIAGNOSIS — I10 Essential (primary) hypertension: Secondary | ICD-10-CM

## 2020-10-13 ENCOUNTER — Other Ambulatory Visit: Payer: Self-pay | Admitting: Family Medicine

## 2020-10-13 DIAGNOSIS — I10 Essential (primary) hypertension: Secondary | ICD-10-CM

## 2020-11-10 ENCOUNTER — Other Ambulatory Visit: Payer: Self-pay | Admitting: Primary Care

## 2020-11-10 DIAGNOSIS — I10 Essential (primary) hypertension: Secondary | ICD-10-CM

## 2021-01-18 ENCOUNTER — Other Ambulatory Visit: Payer: Self-pay | Admitting: Primary Care

## 2021-01-18 DIAGNOSIS — Z1231 Encounter for screening mammogram for malignant neoplasm of breast: Secondary | ICD-10-CM

## 2021-02-22 ENCOUNTER — Ambulatory Visit: Payer: Managed Care, Other (non HMO)

## 2021-06-09 IMAGING — MG DIGITAL SCREENING BILAT W/ TOMO W/ CAD
8 series · 8 of 24 positions shown · non-contrast
Comparison: Previous exam(s).

CLINICAL DATA: Screening.

EXAM:
DIGITAL SCREENING BILATERAL MAMMOGRAM WITH TOMO AND CAD

[L MLO synth-2D]
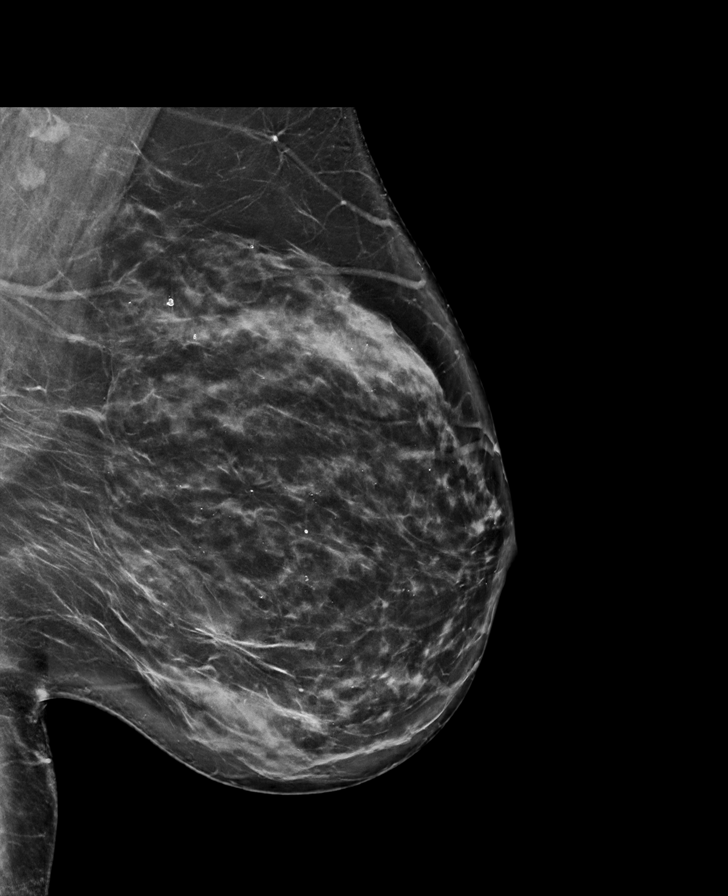

[R CC synth-2D]
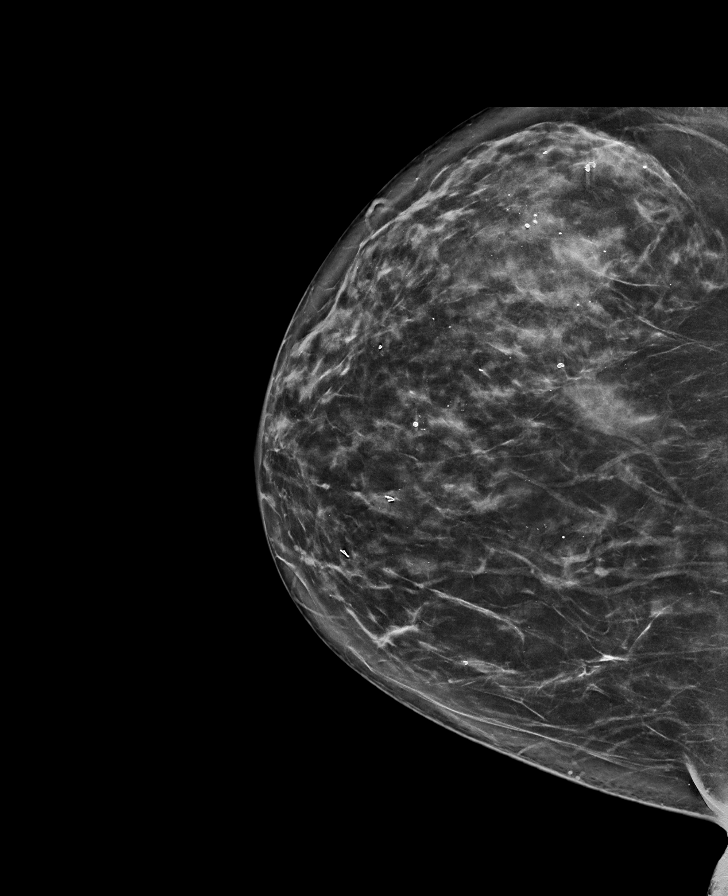

[L CC synth-2D]
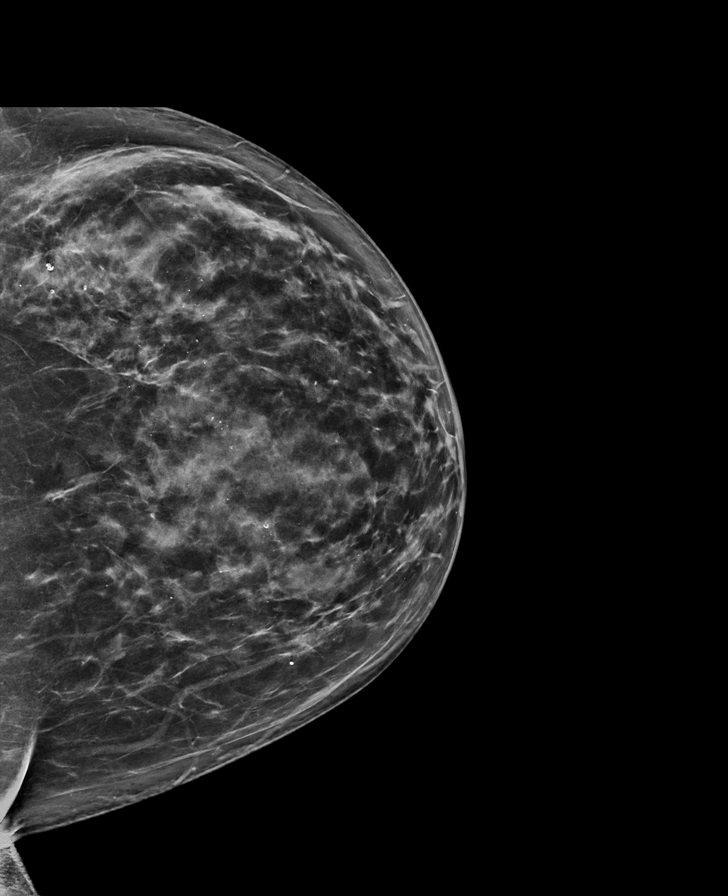

[R MLO synth-2D]
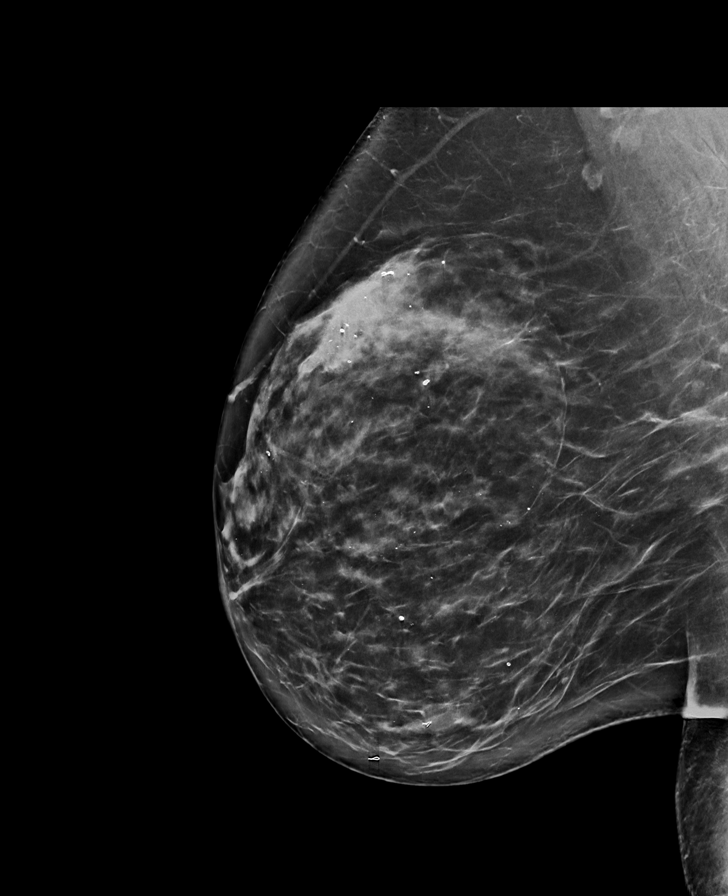

[L CC tomo · tomo slice 43/85.0]
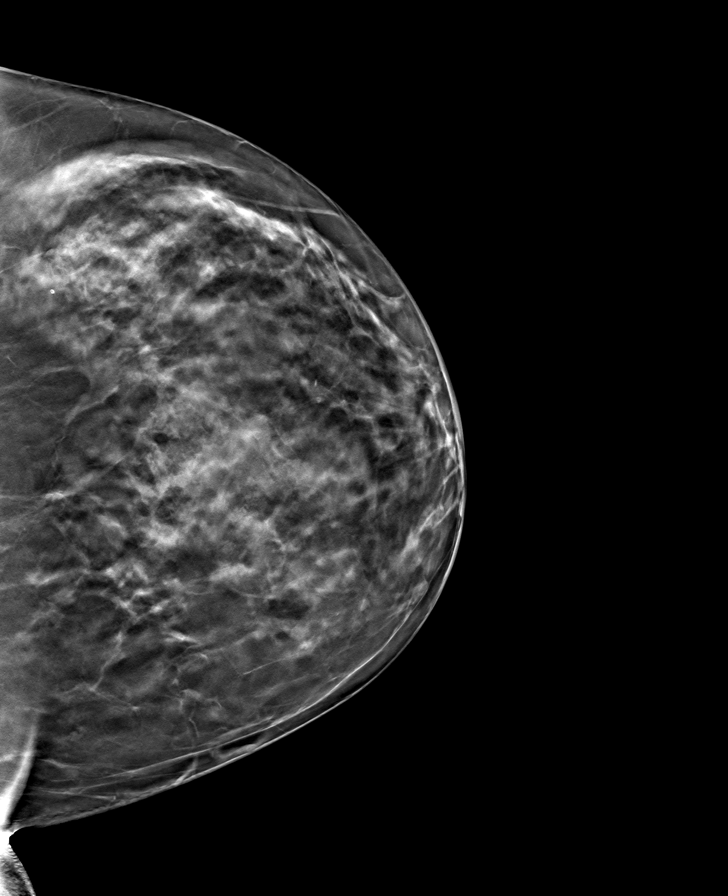

[R MLO tomo · tomo slice 47/94.0]
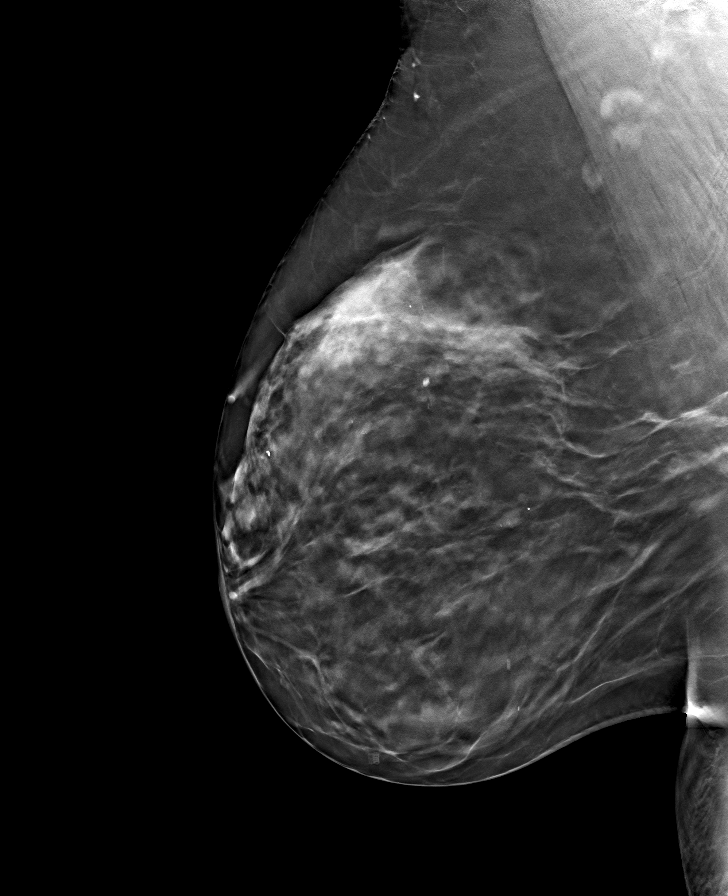

[R CC tomo · tomo slice 43/85.0]
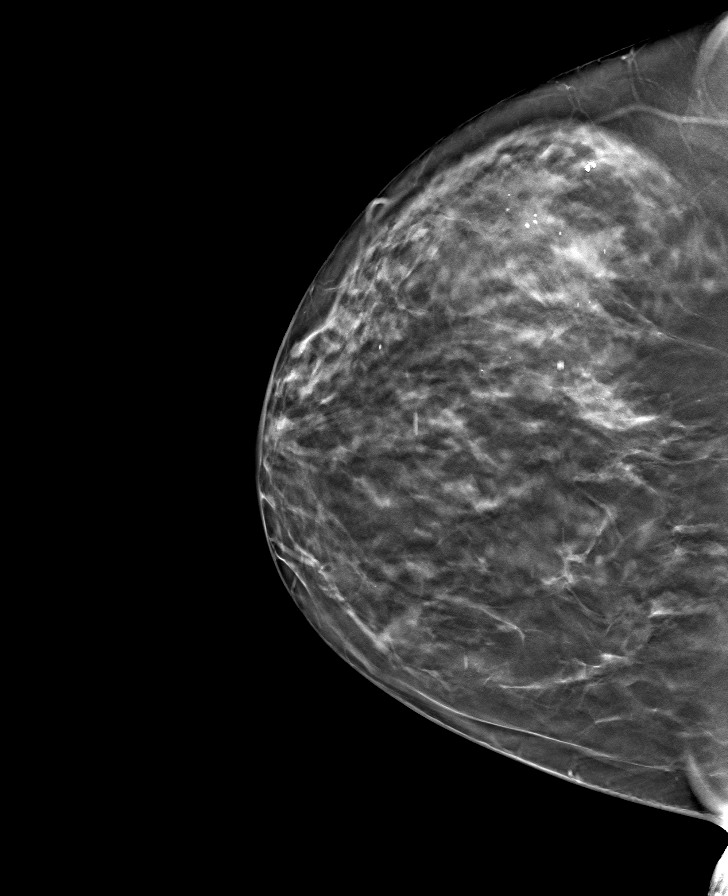

[L MLO tomo · tomo slice 47/94.0]
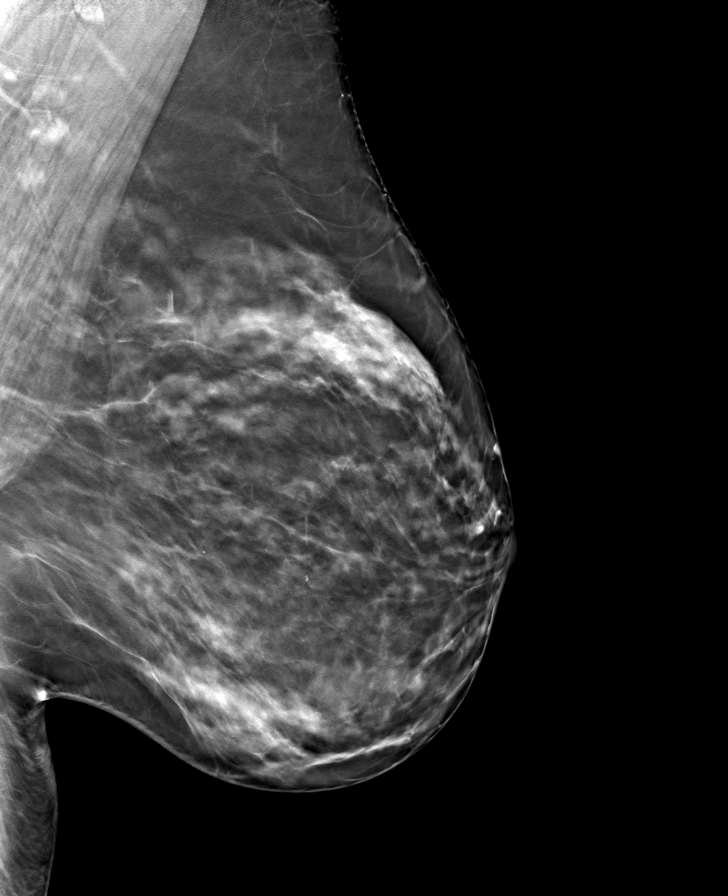

[8 of 24 positions shown; findings below may reference images not displayed]

ACR Breast Density Category c: The breast tissue is heterogeneously
dense, which may obscure small masses.
FINDINGS: There are no findings suspicious for malignancy. Images were
processed with CAD.
IMPRESSION: No mammographic evidence of malignancy. A result letter of this
screening mammogram will be mailed directly to the patient.

RECOMMENDATION:
Screening mammogram in one year. (Code:[id number])

BI-RADS CATEGORY  1: Negative.

## 2021-07-13 ENCOUNTER — Ambulatory Visit
Admission: RE | Admit: 2021-07-13 | Discharge: 2021-07-13 | Disposition: A | Discharge status: Home / Self Care | Payer: Managed Care, Other (non HMO) | Source: Ambulatory Visit | Attending: Primary Care | Admitting: Primary Care

## 2021-07-13 DIAGNOSIS — Z1231 Encounter for screening mammogram for malignant neoplasm of breast: Secondary | ICD-10-CM

## 2021-07-28 ENCOUNTER — Other Ambulatory Visit: Payer: Self-pay

## 2021-07-28 ENCOUNTER — Ambulatory Visit: Payer: Managed Care, Other (non HMO) | Admitting: Primary Care

## 2021-07-28 ENCOUNTER — Encounter: Payer: Self-pay | Admitting: Primary Care

## 2021-07-28 VITALS — BP 127/72 | HR 98 | Temp 98.6°F | Ht 65.0 in | Wt 250.0 lb

## 2021-07-28 DIAGNOSIS — I10 Essential (primary) hypertension: Secondary | ICD-10-CM | POA: Diagnosis not present

## 2021-07-28 DIAGNOSIS — Z0000 Encounter for general adult medical examination without abnormal findings: Secondary | ICD-10-CM

## 2021-07-28 MED ORDER — LISINOPRIL 20 MG PO TABS: 20.0000 mg | ORAL_TABLET | Freq: Every day | ORAL | 3 refills | Status: DC

## 2021-07-28 NOTE — Addendum Note (Signed)
Addended by: Ellamae Sia on: 07/28/2021 08:13 AM   Modules accepted: Orders

## 2021-07-28 NOTE — Assessment & Plan Note (Signed)
Immunizations UTD.  Mammogram UTD. Colonoscopy UTD, due 2026.  Discussed the importance of a healthy diet and regular exercise in order for weight loss, and to reduce the risk of further co-morbidity.  Exam stable. Labs pending.  Follow up in 1 year for repeat physical.

## 2021-07-28 NOTE — Assessment & Plan Note (Signed)
Controlled.  Continue lisinopril 20 mg daily.  CMP pending. 

## 2021-07-28 NOTE — Progress Notes (Signed)
Subjective:    Patient ID: Sherri Barrett, female    DOB: 1965/10/17, 56 y.o.   MRN: [id number]  HPI  Sherri Barrett is a very pleasant 56 y.o. female who presents today for complete physical and follow up of chronic conditions.  She has not been seen since June 2021.   Immunizations: -Tetanus: 2020 -Influenza: Completed last season  -Covid-19: 3 vaccines  -Shingles: Completed Shingrix series  Diet: Fair diet.  Exercise: No regular exercise.  Eye exam: Completes annually  Dental exam: Completes semi-annually   Pap Smear: Hysterectomy  Mammogram: Completed in May 2023  Colonoscopy: Completed in 2016, due 2026  BP Readings from Last 3 Encounters:  07/28/21 127/72  08/04/19 124/82  02/28/18 114/72           Review of Systems  Constitutional:  Negative for unexpected weight change.  HENT:  Negative for rhinorrhea.   Respiratory:  Negative for cough and shortness of breath.   Cardiovascular:  Negative for chest pain.  Gastrointestinal:  Negative for constipation and diarrhea.  Genitourinary:  Negative for difficulty urinating.  Musculoskeletal:  Negative for arthralgias and myalgias.  Skin:  Negative for rash.  Allergic/Immunologic: Negative for environmental allergies.  Neurological:  Negative for dizziness, numbness and headaches.  Psychiatric/Behavioral:  The patient is not nervous/anxious.          Past Medical History:  Diagnosis Date   Hypertension     Social History   Socioeconomic History   Marital status: Married    Spouse name: Not on file   Number of children: Not on file   Years of education: Not on file   Highest education level: Not on file  Occupational History   Not on file  Tobacco Use   Smoking status: Never   Smokeless tobacco: Never  Substance and Sexual Activity   Alcohol use: Yes    Alcohol/week: 1.0 standard drink of alcohol    Types: 1 Glasses of wine per week    Comment: wine- occasionally   Drug use: No   Sexual  activity: Not on file  Other Topics Concern   Not on file  Social History Narrative   Married.   Three children.   Works at [employer].   Enjoys spending time at [employer], spending time with family.    Social Determinants of Health   Financial Resource Strain: Not on file  Food Insecurity: Not on file  Transportation Needs: Not on file  Physical Activity: Not on file  Stress: Not on file  Social Connections: Not on file  Intimate Partner Violence: Not on file    Past Surgical History:  Procedure Laterality Date   ABDOMINAL HYSTERECTOMY     APPENDECTOMY     BREAST BIOPSY Right    x2   CESAREAN SECTION     x3   WISDOM TOOTH EXTRACTION      Family History  Problem Relation Age of Onset   Breast cancer Mother 64   Hypertension Mother    Anuerysm Mother    Lung cancer Father    Heart disease Father    Hypertension Father    Colon cancer Neg Hx    Esophageal cancer Neg Hx    Rectal cancer Neg Hx    Stomach cancer Neg Hx     No Known Allergies  No current outpatient medications on file prior to visit.   No current facility-administered medications on file prior to visit.    BP 127/72   Pulse 98  Temp 98.6 F (37 C) (Oral)   Ht '5\' 5"'$  (1.651 m)   Wt 250 lb (113.4 kg) Comment: per pt  SpO2 98%   BMI 41.60 kg/m  Objective:   Physical Exam HENT:     Right Ear: Tympanic membrane and ear canal normal.     Left Ear: Tympanic membrane and ear canal normal.     Nose: Nose normal.  Eyes:     Conjunctiva/sclera: Conjunctivae normal.     Pupils: Pupils are equal, round, and reactive to light.  Neck:     Thyroid: No thyromegaly.  Cardiovascular:     Rate and Rhythm: Normal rate and regular rhythm.     Heart sounds: No murmur heard. Pulmonary:     Effort: Pulmonary effort is normal.     Breath sounds: Normal breath sounds. No rales.  Abdominal:     General: Bowel sounds are normal.     Palpations: Abdomen is soft.     Tenderness: There is no abdominal  tenderness.  Musculoskeletal:        General: Normal range of motion.     Cervical back: Neck supple.  Lymphadenopathy:     Cervical: No cervical adenopathy.  Skin:    General: Skin is warm and dry.     Findings: No rash.  Neurological:     Mental Status: She is alert and oriented to person, place, and time.     Cranial Nerves: No cranial nerve deficit.     Deep Tendon Reflexes: Reflexes are normal and symmetric.  Psychiatric:        Mood and Affect: Mood normal.           Assessment & Plan:   Problem List Items Addressed This Visit       Cardiovascular and Mediastinum   Essential hypertension    Controlled.  Continue lisinopril 20 mg daily.  CMP pending.       Relevant Medications   lisinopril (ZESTRIL) 20 MG tablet   Other Relevant Orders   Lipid panel   Hemoglobin A1c   Comprehensive metabolic panel   CBC     Other   Preventative health care - Primary    Immunizations UTD.  Mammogram UTD. Colonoscopy UTD, due 2026.  Discussed the importance of a healthy diet and regular exercise in order for weight loss, and to reduce the risk of further co-morbidity.  Exam stable. Labs pending.  Follow up in 1 year for repeat physical.           Pleas Koch, NP

## 2021-07-28 NOTE — Patient Instructions (Signed)
Stop by the lab prior to leaving today. I will notify you of your results once received.   It was a pleasure to see you today!  Preventive Care 78-58 Years Old, Female Preventive care refers to lifestyle choices and visits with your health care provider that can promote health and wellness. Preventive care visits are also called wellness exams. What can I expect for my preventive care visit? Counseling Your health care provider may ask you questions about your: Medical history, including: Past medical problems. Family medical history. Pregnancy history. Current health, including: Menstrual cycle. Method of birth control. Emotional well-being. Home life and relationship well-being. Sexual activity and sexual health. Lifestyle, including: Alcohol, nicotine or tobacco, and drug use. Access to firearms. Diet, exercise, and sleep habits. Work and work Statistician. Sunscreen use. Safety issues such as seatbelt and bike helmet use. Physical exam Your health care provider will check your: Height and weight. These may be used to calculate your BMI (body mass index). BMI is a measurement that tells if you are at a healthy weight. Waist circumference. This measures the distance around your waistline. This measurement also tells if you are at a healthy weight and may help predict your risk of certain diseases, such as type 2 diabetes and high blood pressure. Heart rate and blood pressure. Body temperature. Skin for abnormal spots. What immunizations do I need?  Vaccines are usually given at various ages, according to a schedule. Your health care provider will recommend vaccines for you based on your age, medical history, and lifestyle or other factors, such as travel or where you work. What tests do I need? Screening Your health care provider may recommend screening tests for certain conditions. This may include: Lipid and cholesterol levels. Diabetes screening. This is done by checking  your blood sugar (glucose) after you have not eaten for a while (fasting). Pelvic exam and Pap test. Hepatitis B test. Hepatitis C test. HIV (human immunodeficiency virus) test. STI (sexually transmitted infection) testing, if you are at risk. Lung cancer screening. Colorectal cancer screening. Mammogram. Talk with your health care provider about when you should start having regular mammograms. This may depend on whether you have a family history of breast cancer. BRCA-related cancer screening. This may be done if you have a family history of breast, ovarian, tubal, or peritoneal cancers. Bone density scan. This is done to screen for osteoporosis. Talk with your health care provider about your test results, treatment options, and if necessary, the need for more tests. Follow these instructions at home: Eating and drinking  Eat a diet that includes fresh fruits and vegetables, whole grains, lean protein, and low-fat dairy products. Take vitamin and mineral supplements as recommended by your health care provider. Do not drink alcohol if: Your health care provider tells you not to drink. You are pregnant, may be pregnant, or are planning to become pregnant. If you drink alcohol: Limit how much you have to 0-1 drink a day. Know how much alcohol is in your drink. In the [place] one drink equals one 12 oz bottle of beer (355 mL), one 5 oz glass of wine (148 mL), or one 1 oz glass of hard liquor (44 mL). Lifestyle Brush your teeth every morning and night with fluoride toothpaste. Floss one time each day. Exercise for at least 30 minutes 5 or more days each week. Do not use any products that contain nicotine or tobacco. These products include cigarettes, chewing tobacco, and vaping devices, such as e-cigarettes. If you need  help quitting, ask your health care provider. Do not use drugs. If you are sexually active, practice safe sex. Use a condom or other form of protection to prevent STIs. If you  do not wish to become pregnant, use a form of birth control. If you plan to become pregnant, see your health care provider for a prepregnancy visit. Take aspirin only as told by your health care provider. Make sure that you understand how much to take and what form to take. Work with your health care provider to find out whether it is safe and beneficial for you to take aspirin daily. Find healthy ways to manage stress, such as: Meditation, yoga, or listening to music. Journaling. Talking to a trusted person. Spending time with friends and family. Minimize exposure to UV radiation to reduce your risk of skin cancer. Safety Always wear your seat belt while driving or riding in a vehicle. Do not drive: If you have been drinking alcohol. Do not ride with someone who has been drinking. When you are tired or distracted. While texting. If you have been using any mind-altering substances or drugs. Wear a helmet and other protective equipment during sports activities. If you have firearms in your house, make sure you follow all gun safety procedures. Seek help if you have been physically or sexually abused. What's next? Visit your health care provider once a year for an annual wellness visit. Ask your health care provider how often you should have your eyes and teeth checked. Stay up to date on all vaccines. This information is not intended to replace advice given to you by your health care provider. Make sure you discuss any questions you have with your health care provider. Document Revised: 08/03/2020 Document Reviewed: 08/03/2020 Elsevier Patient Education  [place].

## 2021-07-29 LAB — COMPREHENSIVE METABOLIC PANEL
ALT: 34 IU/L — ABNORMAL HIGH (ref 0–32)
AST: 31 IU/L (ref 0–40)
Albumin/Globulin Ratio: 1.9 (ref 1.2–2.2)
Albumin: 4.3 g/dL (ref 3.8–4.9)
Alkaline Phosphatase: 92 IU/L (ref 44–121)
BUN/Creatinine Ratio: 16 (ref 9–23)
BUN: 15 mg/dL (ref 6–24)
Bilirubin Total: 0.6 mg/dL (ref 0.0–1.2)
CO2: 23 mmol/L (ref 20–29)
Calcium: 9.5 mg/dL (ref 8.7–10.2)
Chloride: 105 mmol/L (ref 96–106)
Creatinine, Ser: 0.96 mg/dL (ref 0.57–1.00)
Globulin, Total: 2.3 g/dL (ref 1.5–4.5)
Glucose: 89 mg/dL (ref 70–99)
Potassium: 4.4 mmol/L (ref 3.5–5.2)
Sodium: 140 mmol/L (ref 134–144)
Total Protein: 6.6 g/dL (ref 6.0–8.5)
eGFR: 69 mL/min/{1.73_m2} (ref >59)

## 2021-07-29 LAB — CBC
Hematocrit: 43.9 % (ref 34.0–46.6)
Hemoglobin: 14.8 g/dL (ref 11.1–15.9)
MCH: 32.1 pg (ref 26.6–33.0)
MCHC: 33.7 g/dL (ref 31.5–35.7)
MCV: 95 fL (ref 79–97)
Platelets: 206 10*3/uL (ref 150–450)
RBC: 4.61 x10E6/uL (ref 3.77–5.28)
RDW: 12 % (ref 11.7–15.4)
WBC: 5 10*3/uL (ref 3.4–10.8)

## 2021-07-29 LAB — LIPID PANEL
Chol/HDL Ratio: 2.1 ratio (ref 0.0–4.4)
Cholesterol, Total: 232 mg/dL — ABNORMAL HIGH (ref 100–199)
HDL: 108 mg/dL (ref >39)
LDL Chol Calc (NIH): 111 mg/dL — ABNORMAL HIGH (ref 0–99)
Triglycerides: 77 mg/dL (ref 0–149)
VLDL Cholesterol Cal: 13 mg/dL (ref 5–40)

## 2021-07-29 LAB — HEMOGLOBIN A1C
Est. average glucose Bld gHb Est-mCnc: 100 mg/dL
Hgb A1c MFr Bld: 5.1 % (ref 4.8–5.6)

## 2021-09-19 NOTE — Telephone Encounter (Signed)
Printed form filled out placed in your box for review.

## 2021-09-20 NOTE — Telephone Encounter (Signed)
Forms completed and placed in Sherri Barrett's inbox. MyChart message sent to patient asking if they will be picking up the forms or if we will be faxing.

## 2021-09-22 NOTE — Telephone Encounter (Signed)
Forms needed patient signatures. I have called Janeshia. She or her husband will come by and pick up. They will resend via my chart so that we can fax over after signed.

## 2022-05-31 ENCOUNTER — Other Ambulatory Visit: Payer: Self-pay | Admitting: Primary Care

## 2022-05-31 DIAGNOSIS — I10 Essential (primary) hypertension: Secondary | ICD-10-CM

## 2023-04-05 ENCOUNTER — Encounter: Payer: Self-pay | Admitting: Primary Care

## 2023-04-05 ENCOUNTER — Ambulatory Visit: Payer: Managed Care, Other (non HMO) | Admitting: Primary Care

## 2023-04-05 VITALS — BP 122/76 | HR 79 | Temp 97.8°F | Ht 65.0 in | Wt 235.0 lb

## 2023-04-05 DIAGNOSIS — Z0000 Encounter for general adult medical examination without abnormal findings: Secondary | ICD-10-CM | POA: Diagnosis not present

## 2023-04-05 DIAGNOSIS — Z1231 Encounter for screening mammogram for malignant neoplasm of breast: Secondary | ICD-10-CM | POA: Diagnosis not present

## 2023-04-05 DIAGNOSIS — E785 Hyperlipidemia, unspecified: Secondary | ICD-10-CM

## 2023-04-05 DIAGNOSIS — I10 Essential (primary) hypertension: Secondary | ICD-10-CM

## 2023-04-05 NOTE — Assessment & Plan Note (Signed)
Immunizations UTD. Influenza vaccine provided today.  Mammogram due, orders placed. Colonoscopy UTD, due 2026  Discussed the importance of a healthy diet and regular exercise in order for weight loss, and to reduce the risk of further co-morbidity.  Exam stable. Labs pending.  Follow up in 1 year for repeat physical.

## 2023-04-05 NOTE — Assessment & Plan Note (Signed)
Controlled.  Continue lisinopril 20 mg daily.  CMP pending.

## 2023-04-05 NOTE — Patient Instructions (Signed)
Stop by the lab prior to leaving today. I will notify you of your results once received.   [facility].   It was a pleasure to see you today!

## 2023-04-05 NOTE — Progress Notes (Signed)
Subjective:    Patient ID: Sherri Barrett, female    DOB: Jul 11, 1965, 58 y.o.   MRN: [id number]  HPI  Sherri Barrett is a very pleasant 58 y.o. female who presents today for complete physical and follow up of chronic conditions.  Immunizations: -Tetanus: Completed in 2020 -Influenza: Influenza vaccine provided today. -Shingles: Completed Shingrix series  Diet: Fair diet.  Exercise: No regular exercise.  Eye exam: Completes annually  Dental exam: Completes semi-annually    Pap Smear: Hysterectomy Mammogram: Completed in May 2023  Colonoscopy: Completed in 2016, due 2026  BP Readings from Last 3 Encounters:  04/05/23 122/76  07/28/21 127/72  08/04/19 124/82       Review of Systems  Constitutional:  Negative for unexpected weight change.  HENT:  Negative for rhinorrhea.   Respiratory:  Negative for cough and shortness of breath.   Cardiovascular:  Negative for chest pain.  Gastrointestinal:  Negative for constipation and diarrhea.  Genitourinary:  Negative for difficulty urinating.  Musculoskeletal:  Negative for arthralgias and myalgias.  Skin:  Negative for rash.  Allergic/Immunologic: Negative for environmental allergies.  Neurological:  Negative for dizziness, numbness and headaches.  Psychiatric/Behavioral:  The patient is not nervous/anxious.          Past Medical History:  Diagnosis Date   Hypertension     Social History   Socioeconomic History   Marital status: Married    Spouse name: Not on file   Number of children: Not on file   Years of education: Not on file   Highest education level: Not on file  Occupational History   Not on file  Tobacco Use   Smoking status: Never   Smokeless tobacco: Never  Substance and Sexual Activity   Alcohol use: Yes    Alcohol/week: 1.0 standard drink of alcohol    Types: 1 Glasses of wine per week    Comment: wine- occasionally   Drug use: No   Sexual activity: Not on file  Other Topics Concern   Not  on file  Social History Narrative   Married.   Three children.   Works at [employer].   Enjoys spending time at [employer], spending time with family.    Social Drivers of Corporate investment banker Strain: Not on file  Food Insecurity: Not on file  Transportation Needs: Not on file  Physical Activity: Not on file  Stress: Not on file  Social Connections: Not on file  Intimate Partner Violence: Not on file    Past Surgical History:  Procedure Laterality Date   ABDOMINAL HYSTERECTOMY     APPENDECTOMY     BREAST BIOPSY Right    x2   CESAREAN SECTION     x3   WISDOM TOOTH EXTRACTION      Family History  Problem Relation Age of Onset   Breast cancer Mother 24   Hypertension Mother    Anuerysm Mother    Lung cancer Father    Heart disease Father    Hypertension Father    Colon cancer Neg Hx    Esophageal cancer Neg Hx    Rectal cancer Neg Hx    Stomach cancer Neg Hx     No Known Allergies  Current Outpatient Medications on File Prior to Visit  Medication Sig Dispense Refill   lisinopril (ZESTRIL) 20 MG tablet Take 1 tablet (20 mg total) by mouth daily. for blood pressure 90 tablet 3   No current facility-administered medications on file prior to  visit.    BP 122/76   Pulse 79   Temp 97.8 F (36.6 C) (Temporal)   Ht 5\' 5"  (1.651 m)   Wt 235 lb (106.6 kg)   SpO2 99%   BMI 39.11 kg/m  Objective:   Physical Exam HENT:     Right Ear: Tympanic membrane and ear canal normal.     Left Ear: Tympanic membrane and ear canal normal.  Eyes:     Pupils: Pupils are equal, round, and reactive to light.  Cardiovascular:     Rate and Rhythm: Normal rate and regular rhythm.  Pulmonary:     Effort: Pulmonary effort is normal.     Breath sounds: Normal breath sounds.  Abdominal:     General: Bowel sounds are normal.     Palpations: Abdomen is soft.     Tenderness: There is no abdominal tenderness.  Musculoskeletal:        General: Normal range of motion.     Cervical  back: Neck supple.  Skin:    General: Skin is warm and dry.  Neurological:     Mental Status: She is alert and oriented to person, place, and time.     Cranial Nerves: No cranial nerve deficit.     Deep Tendon Reflexes:     Reflex Scores:      Patellar reflexes are 2+ on the right side and 2+ on the left side. Psychiatric:        Mood and Affect: Mood normal.           Assessment & Plan:  Preventative health care Assessment & Plan: Immunizations UTD. Influenza vaccine provided today.  Mammogram due, orders placed. Colonoscopy UTD, due 2026  Discussed the importance of a healthy diet and regular exercise in order for weight loss, and to reduce the risk of further co-morbidity.  Exam stable. Labs pending.  Follow up in 1 year for repeat physical.    Essential hypertension Assessment & Plan: Controlled.  Continue lisinopril 20 mg daily. CMP pending.  Orders: -     Comprehensive metabolic panel -     CBC  Screening mammogram for breast cancer -     3D Screening Mammogram, Left and Right; Future  Hyperlipidemia, unspecified hyperlipidemia type -     Lipid panel        Doreene Nest, NP

## 2023-04-06 LAB — COMPREHENSIVE METABOLIC PANEL
ALT: 16 [IU]/L (ref 0–32)
AST: 15 [IU]/L (ref 0–40)
Albumin: 4.1 g/dL (ref 3.8–4.9)
Alkaline Phosphatase: 97 [IU]/L (ref 44–121)
BUN/Creatinine Ratio: 16 (ref 9–23)
BUN: 14 mg/dL (ref 6–24)
Bilirubin Total: 0.4 mg/dL (ref 0.0–1.2)
CO2: 23 mmol/L (ref 20–29)
Calcium: 9.1 mg/dL (ref 8.7–10.2)
Chloride: 104 mmol/L (ref 96–106)
Creatinine, Ser: 0.87 mg/dL (ref 0.57–1.00)
Globulin, Total: 2.1 g/dL (ref 1.5–4.5)
Glucose: 92 mg/dL (ref 70–99)
Potassium: 4.6 mmol/L (ref 3.5–5.2)
Sodium: 141 mmol/L (ref 134–144)
Total Protein: 6.2 g/dL (ref 6.0–8.5)
eGFR: 77 mL/min/{1.73_m2} (ref >59)

## 2023-04-06 LAB — CBC
Hematocrit: 43.9 % (ref 34.0–46.6)
Hemoglobin: 14.4 g/dL (ref 11.1–15.9)
MCH: 31.4 pg (ref 26.6–33.0)
MCHC: 32.8 g/dL (ref 31.5–35.7)
MCV: 96 fL (ref 79–97)
Platelets: 219 10*3/uL (ref 150–450)
RBC: 4.59 x10E6/uL (ref 3.77–5.28)
RDW: 12.2 % (ref 11.7–15.4)
WBC: 6.1 10*3/uL (ref 3.4–10.8)

## 2023-04-06 LAB — LIPID PANEL
Chol/HDL Ratio: 2.4 {ratio} (ref 0.0–4.4)
Cholesterol, Total: 219 mg/dL — ABNORMAL HIGH (ref 100–199)
HDL: 90 mg/dL (ref >39)
LDL Chol Calc (NIH): 116 mg/dL — ABNORMAL HIGH (ref 0–99)
Triglycerides: 75 mg/dL (ref 0–149)
VLDL Cholesterol Cal: 13 mg/dL (ref 5–40)

## 2023-04-19 ENCOUNTER — Ambulatory Visit
Admission: RE | Admit: 2023-04-19 | Discharge: 2023-04-19 | Disposition: A | Discharge status: Home / Self Care | Payer: Managed Care, Other (non HMO) | Source: Ambulatory Visit | Attending: Primary Care | Admitting: Primary Care

## 2023-04-19 DIAGNOSIS — Z1231 Encounter for screening mammogram for malignant neoplasm of breast: Secondary | ICD-10-CM

## 2023-04-19 DIAGNOSIS — I10 Essential (primary) hypertension: Secondary | ICD-10-CM

## 2023-04-22 MED ORDER — LISINOPRIL 20 MG PO TABS: 20.0000 mg | ORAL_TABLET | Freq: Every day | ORAL | 2 refills | Status: DC

## 2023-04-24 ENCOUNTER — Other Ambulatory Visit: Payer: Self-pay | Admitting: Primary Care

## 2023-04-24 DIAGNOSIS — R928 Other abnormal and inconclusive findings on diagnostic imaging of breast: Secondary | ICD-10-CM

## 2023-05-03 ENCOUNTER — Other Ambulatory Visit: Payer: Self-pay | Admitting: Primary Care

## 2023-05-03 ENCOUNTER — Ambulatory Visit
Admission: RE | Admit: 2023-05-03 | Discharge: 2023-05-03 | Disposition: A | Discharge status: Home / Self Care | Source: Ambulatory Visit | Attending: Primary Care | Admitting: Primary Care

## 2023-05-03 DIAGNOSIS — R928 Other abnormal and inconclusive findings on diagnostic imaging of breast: Secondary | ICD-10-CM

## 2023-05-03 DIAGNOSIS — N6489 Other specified disorders of breast: Secondary | ICD-10-CM

## 2023-07-16 ENCOUNTER — Telehealth: Admitting: Physician Assistant

## 2023-07-16 DIAGNOSIS — B9689 Other specified bacterial agents as the cause of diseases classified elsewhere: Secondary | ICD-10-CM | POA: Diagnosis not present

## 2023-07-16 DIAGNOSIS — J208 Acute bronchitis due to other specified organisms: Secondary | ICD-10-CM

## 2023-07-16 MED ORDER — AZITHROMYCIN 250 MG PO TABS: ORAL_TABLET | ORAL | 0 refills | Status: AC

## 2023-07-16 MED ORDER — BENZONATATE 100 MG PO CAPS: 100.0000 mg | ORAL_CAPSULE | Freq: Three times a day (TID) | ORAL | 0 refills | Status: DC | PRN

## 2023-07-16 NOTE — Progress Notes (Signed)

## 2023-07-16 NOTE — Progress Notes (Signed)
 I have spent 5 minutes in review of e-visit questionnaire, review and updating patient chart, medical decision making and response to patient.   Piedad Climes, PA-C

## 2023-07-23 ENCOUNTER — Ambulatory Visit: Payer: Self-pay

## 2023-07-23 NOTE — Telephone Encounter (Signed)
 Noted.  Appreciate [name]'s evaluation.

## 2023-07-23 NOTE — Telephone Encounter (Signed)
 noted

## 2023-07-23 NOTE — Telephone Encounter (Signed)
 Copied from CRM [id number]. Topic: Clinical - Red Word Triage >> Jul 23, 2023 11:48 AM Allyne Areola wrote: Red Word that prompted transfer to Nurse Triage: Patient did a virtual visit on 07/16/2023 due to a cough she was experiencing, antibiotics and cough medicine was prescribed. She is still experiencing the cough but also fatigued. She did find a tick on her chest last Tuesday and removed it but feels like she may be experiencing symptoms of lyme disease. The area is a bit swollen with a white mark in the middle.  Chief Complaint: States cough is not gone and finished her antibiotic. Found tick on her last night, bite area red Symptoms: cough not better, fatigue Frequency: constant Pertinent Negatives: Patient denies fever, rash, headache Disposition: [] ED /[] Urgent Care (no appt availability in office) / [x] Appointment(In office/virtual)/ []  [facility]/ [] Home Care/ [] Refused Recommended Disposition /[] [facility]/ []  Follow-up with PCP Additional Notes: apt made for tomorrow; care advice given, denies questions; instructed to go to ER if becomes worse.   Reason for Disposition  [1] Probable deer tick AND [2] attached 36 hours or more (or tick appears swollen, not flat) AND [3] occurred in an area where Lyme disease is common  Answer Assessment - Initial Assessment Questions 1. ATTACHED:  "Is the tick still on the skin?"  (e.g., yes, no, unsure)     removed 2. ONSET - TICK STILL ATTACHED:  "How long do you think the tick has been on your skin?" (e.g., hours, days, unsure)  Note:  Is there a recent activity (camping, hiking) where the caller may have been exposed?     Last night, was on chest/side of breast 3. ONSET - TICK NOT STILL ATTACHED: "If the tick has been removed, how long do you think the tick was attached before you removed it?" (e.g., 5 hours, 2 days). "When was this?"     no 4. LOCATION: "Where is the tick bite located?" (e.g., arm, leg)     Side of breast; has a  red circle mark with bump 5. TYPE of TICK: "Is it a wood tick or a deer tick?" (e.g., deer tick, wood tick; unsure)     No, was black 6. SIZE of TICK: "How big is the tick?" (e.g., size of poppy seed, apple seed, watermelon seed; unsure) Note: Deer ticks can be the size of a poppy seed (nymph) or an apple seed (adult).       Poppy seed 7. ENGORGED: "Did the tick look flat or engorged (full, swollen)?" (e.g., flat, engorged; unsure)     yes 8. OTHER SYMPTOMS: "Do you have any other symptoms?" (e.g., fever, rash, redness at bite area, red ring around bite)     Redness at bite area 9. PREGNANCY: "Is there any chance you are pregnant?" "When was your last menstrual period?"     na  Protocols used: Tick Bite-A-AH

## 2023-07-24 ENCOUNTER — Encounter: Payer: Self-pay | Admitting: General Practice

## 2023-07-24 ENCOUNTER — Ambulatory Visit: Admitting: General Practice

## 2023-07-24 VITALS — BP 118/72 | HR 90 | Temp 97.7°F | Ht 65.0 in | Wt 216.0 lb

## 2023-07-24 DIAGNOSIS — W57XXXA Bitten or stung by nonvenomous insect and other nonvenomous arthropods, initial encounter: Secondary | ICD-10-CM | POA: Insufficient documentation

## 2023-07-24 DIAGNOSIS — S20162A Insect bite (nonvenomous) of breast, left breast, initial encounter: Secondary | ICD-10-CM | POA: Diagnosis not present

## 2023-07-24 NOTE — Assessment & Plan Note (Signed)
 Improving overall  No red flags.  Vital signs stable.   Differentials include lyme disease, rocky mountain spotted fever.  Given the lethargy and low energy will check labs.  Await results.

## 2023-07-24 NOTE — Progress Notes (Signed)
 Established Patient Office Visit  Subjective   Patient ID: Sherri Barrett, female    DOB: 1965-12-16  Age: 58 y.o. MRN: [id number]  Chief Complaint  Patient presents with   Tick Removal    Patient found tick about a week ago on her breast. Head and all was removed. Area has been itchy and has been super tired.     HPI  Sherri Barrett is a 58 year old female, patient of Tretha Fu, NP, with past medical history of HTN, obesity presents today for an acute visit.   Tick bite: She went to a cabin in [place]  over the memorial day weekend and got sick there. She had a fever (did not check it) over the memorial day weekend, she is not sure if she had the tick at that time. When she came back on Monday, she still kept feeling bad with fever, chills, dry cough. Completed an e-visit and was prescribed Zpack and Tessalon .  Later that day, she also found a tick on her left breast and removed it.  Describes the area still being swollen with a white mark in the middle. The area is improving and swelling has decreased. But she just feels lethargic and no energy. She tried to go to work yesterday and felt so drained. Cough is better and has finished zpack.   She denies any nausea, vomiting, chest pain, shortness of breath or difficulty breathing.   Patient Active Problem List   Diagnosis Date Noted   Tick bite of left breast 07/24/2023   Nevus 08/04/2019   Preventative health care 08/04/2019   Essential hypertension 10/10/2016   Obesity (BMI 30-39.9) 10/10/2016   Past Medical History:  Diagnosis Date   Hypertension    Past Surgical History:  Procedure Laterality Date   ABDOMINAL HYSTERECTOMY     APPENDECTOMY     BREAST BIOPSY Right    x2   CESAREAN SECTION     x3   WISDOM TOOTH EXTRACTION     No Known Allergies       07/24/2023    8:15 AM 04/05/2023    7:22 AM 07/28/2021    7:49 AM  Depression screen PHQ 2/9  Decreased Interest 0 0 0  Down, Depressed, Hopeless 0 0 0  PHQ - 2  Score 0 0 0  Altered sleeping 0  0  Tired, decreased energy 1  0  Change in appetite 0  0  Feeling bad or failure about yourself  0  0  Trouble concentrating 0  0  Moving slowly or fidgety/restless 0  0  Suicidal thoughts 0  0  PHQ-9 Score 1  0  Difficult doing work/chores Not difficult at all  Not difficult at all       07/24/2023    8:16 AM  GAD 7 : Generalized Anxiety Score  Nervous, Anxious, on Edge 0  Control/stop worrying 0  Worry too much - different things 0  Trouble relaxing 0  Restless 0  Easily annoyed or irritable 0  Afraid - awful might happen 0  Total GAD 7 Score 0  Anxiety Difficulty Not difficult at all      Review of Systems  Constitutional:  Negative for chills and fever.  Respiratory:  Negative for shortness of breath.   Cardiovascular:  Negative for chest pain.  Gastrointestinal:  Negative for abdominal pain, constipation, diarrhea, heartburn, nausea and vomiting.  Genitourinary:  Negative for dysuria, frequency and urgency.  Skin:  Tick bite from left breast  Neurological:  Negative for dizziness and headaches.  Endo/Heme/Allergies:  Negative for polydipsia.  Psychiatric/Behavioral:  Negative for depression and suicidal ideas. The patient is not nervous/anxious.       Objective:     BP 118/72 (BP Location: Left Arm, Patient Position: Sitting, Cuff Size: Normal)   Pulse 90   Temp 97.7 F (36.5 C) (Oral)   Ht 5\' 5"  (1.651 m)   Wt 216 lb (98 kg)   SpO2 97%   BMI 35.94 kg/m  BP Readings from Last 3 Encounters:  07/24/23 118/72  04/05/23 122/76  07/28/21 127/72   Wt Readings from Last 3 Encounters:  07/24/23 216 lb (98 kg)  04/05/23 235 lb (106.6 kg)  07/28/21 250 lb (113.4 kg)      Physical Exam Vitals and nursing note reviewed.  Constitutional:      Appearance: Normal appearance.  Cardiovascular:     Rate and Rhythm: Normal rate and regular rhythm.     Pulses: Normal pulses.     Heart sounds: Normal heart sounds.   Pulmonary:     Effort: Pulmonary effort is normal.     Breath sounds: Normal breath sounds.  Chest:       Comments: Tick bite  Skin:      Neurological:     Mental Status: She is alert and oriented to person, place, and time.  Psychiatric:        Mood and Affect: Mood normal.        Behavior: Behavior normal.        Thought Content: Thought content normal.        Judgment: Judgment normal.        No results found for any visits on 07/24/23.     The 10-year ASCVD risk score (Arnett DK, et al., 2019) is: 2.9%    Assessment & Plan:  Tick bite of left breast, initial encounter Assessment & Plan: Improving overall  No red flags.  Vital signs stable.   Differentials include lyme disease, rocky mountain spotted fever.  Given the lethargy and low energy will check labs.  Await results.  Orders: -     B. burgdorfi antibodies by WB -     Rocky mtn spotted fvr abs pnl(IgG+IgM) -     CBC with Differential/Platelet -     Comprehensive metabolic panel with GFR     Return if symptoms worsen or fail to improve.    Jolanda Nation, NP

## 2023-07-24 NOTE — Patient Instructions (Addendum)
 Stop by the lab prior to leaving today. I will notify you of your results once received.   If you develop fever, chills, nausea, vomiting, chest pain or shortness of breath.   It was a pleasure meeting you!

## 2023-07-25 ENCOUNTER — Ambulatory Visit: Payer: Self-pay | Admitting: General Practice

## 2023-07-25 DIAGNOSIS — A6920 Lyme disease, unspecified: Secondary | ICD-10-CM

## 2023-07-25 LAB — LYME IGG/IGM
Lyme IgG EIA: POSITIVE
Lyme IgM EIA: NEGATIVE
Lyme Interpretation: DETECTED — AB

## 2023-07-25 LAB — LYME DISEASE SEROLOGY W/REFLEX: Lyme Total Antibody EIA: POSITIVE

## 2023-07-26 ENCOUNTER — Ambulatory Visit: Payer: Self-pay

## 2023-07-26 MED ORDER — DOXYCYCLINE HYCLATE 100 MG PO TABS: 100.0000 mg | ORAL_TABLET | Freq: Two times a day (BID) | ORAL | 0 refills | Status: AC

## 2023-07-26 NOTE — Telephone Encounter (Addendum)
 Spoke with pt and assured her that the antibiotic will be sent in for her today per [facility]. I have verified her pharmacy.

## 2023-07-26 NOTE — Telephone Encounter (Signed)
 FYI Only or Action Required?: FYI only for provider  Patient was last seen in primary care on 07/24/2023 by Jolanda Nation, NP. Called Nurse Triage reporting Advice Only. Symptoms began several days ago. Interventions attempted: Nothing. Symptoms are: stable.  Triage Disposition: Information or Advice Only Call  Patient/caregiver understands and will follow disposition?:                                   Copied From CRM [id number]. Reason for Triage: patient already sent Mychart message to provider however, since we are coming up on the weekend and she is anxious about it. She saw that her results were positive for Lyme disease on Mychart and wants to make sure that an antibiotic gets called in for her before the weekend.  Reason for Disposition  Health Information question, no triage required and triager able to answer question  Answer Assessment - Initial Assessment Questions 1. REASON FOR CALL or QUESTION: "What is your reason for calling today?" or "How can I best help you?" or "What question do you have that I can help answer?"     Patient called in to ask if an antibiotic was going to be prescribed for her positive Lyme results. Per chart, provider will be sending an antibiotic in this afternoon. Patient verbalized understanding.  Protocols used: Information Only Call - No Triage-A-AH

## 2023-07-27 LAB — CBC WITH DIFFERENTIAL/PLATELET
Basophils Absolute: 0.1 10*3/uL (ref 0.0–0.2)
Basos: 1 %
EOS (ABSOLUTE): 0.2 10*3/uL (ref 0.0–0.4)
Eos: 4 %
Hematocrit: 46.4 % (ref 34.0–46.6)
Hemoglobin: 15.4 g/dL (ref 11.1–15.9)
Immature Grans (Abs): 0 10*3/uL (ref 0.0–0.1)
Immature Granulocytes: 0 %
Lymphocytes Absolute: 1.2 10*3/uL (ref 0.7–3.1)
Lymphs: 20 %
MCH: 31.2 pg (ref 26.6–33.0)
MCHC: 33.2 g/dL (ref 31.5–35.7)
MCV: 94 fL (ref 79–97)
Monocytes Absolute: 0.4 10*3/uL (ref 0.1–0.9)
Monocytes: 7 %
Neutrophils Absolute: 4 10*3/uL (ref 1.4–7.0)
Neutrophils: 68 %
Platelets: 280 10*3/uL (ref 150–450)
RBC: 4.94 x10E6/uL (ref 3.77–5.28)
RDW: 12.8 % (ref 11.7–15.4)
WBC: 5.9 10*3/uL (ref 3.4–10.8)

## 2023-07-27 LAB — COMPREHENSIVE METABOLIC PANEL WITH GFR
ALT: 18 IU/L (ref 0–32)
AST: 17 IU/L (ref 0–40)
Albumin: 4.3 g/dL (ref 3.8–4.9)
Alkaline Phosphatase: 71 IU/L (ref 44–121)
BUN/Creatinine Ratio: 21 (ref 12–28)
BUN: 16 mg/dL (ref 8–27)
Bilirubin Total: 0.5 mg/dL (ref 0.0–1.2)
CO2: 20 mmol/L (ref 20–29)
Calcium: 9.5 mg/dL (ref 8.7–10.3)
Chloride: 102 mmol/L (ref 96–106)
Creatinine, Ser: 0.78 mg/dL (ref 0.57–1.00)
Globulin, Total: 2.3 g/dL (ref 1.5–4.5)
Glucose: 92 mg/dL (ref 70–99)
Potassium: 4.3 mmol/L (ref 3.5–5.2)
Sodium: 139 mmol/L (ref 134–144)
Total Protein: 6.6 g/dL (ref 6.0–8.5)
eGFR: 87 mL/min/{1.73_m2} (ref >59)

## 2023-07-27 LAB — SPOTTED FEVER GROUP ANTIBODIES
Spotted Fever Group IgG: <1:64 {titer}
Spotted Fever Group IgM: <1:64 {titer}

## 2023-09-10 ENCOUNTER — Ambulatory Visit (INDEPENDENT_AMBULATORY_CARE_PROVIDER_SITE_OTHER): Admitting: Primary Care

## 2023-09-10 VITALS — BP 124/76 | HR 68 | Temp 96.8°F | Ht 65.0 in | Wt 203.0 lb

## 2023-09-10 DIAGNOSIS — E6609 Other obesity due to excess calories: Secondary | ICD-10-CM

## 2023-09-10 DIAGNOSIS — R112 Nausea with vomiting, unspecified: Secondary | ICD-10-CM | POA: Insufficient documentation

## 2023-09-10 DIAGNOSIS — E66811 Obesity, class 1: Secondary | ICD-10-CM | POA: Diagnosis not present

## 2023-09-10 DIAGNOSIS — Z6833 Body mass index (BMI) 33.0-33.9, adult: Secondary | ICD-10-CM

## 2023-09-10 DIAGNOSIS — E785 Hyperlipidemia, unspecified: Secondary | ICD-10-CM | POA: Insufficient documentation

## 2023-09-10 DIAGNOSIS — I10 Essential (primary) hypertension: Secondary | ICD-10-CM

## 2023-09-10 NOTE — Assessment & Plan Note (Signed)
 Commended her on weight loss!  Continue Zepbound 5 mg weekly. Repeat lipid panel pending.

## 2023-09-10 NOTE — Progress Notes (Signed)
 Subjective:    Patient ID: Sherri Barrett, female    DOB: [birth year], 58 y.o.   MRN: [id number]  HPI  Sherri Barrett is a very pleasant 58 y.o. female with a history of hypertension, hyperlipidemia who presents today for form completion and to recheck lipid levels.  She underwent CPE in our office in February 2025.  She presents today with a form from her employer, is needed updated labs.   She began Zepbound 5 mg once weekly in March 2025 through [employer] online through her employer. She is tolerating Zepbound well.   She has experienced nausea and vomiting after eating red meat on two different occasions. These occasions were the day after her Zepound shot. She did sustain a tick bite around late May 2025 [place]. She was tested for Lyme disease in June 2025, not tested for alpha gal.   She is not exercising much, some walking. She's been eating smaller portion sizes, less fast food, increased veggies and lean protein. She is drinking water mostly.  Body mass index is 33.78 kg/m.   Wt Readings from Last 3 Encounters:  09/10/23 203 lb (92.1 kg)  07/24/23 216 lb (98 kg)  04/05/23 235 lb (106.6 kg)   BP Readings from Last 3 Encounters:  09/10/23 124/76  07/24/23 118/72  04/05/23 122/76      Review of Systems  Respiratory:  Negative for shortness of breath.   Cardiovascular:  Negative for chest pain.  Gastrointestinal:  Positive for nausea. Negative for constipation and diarrhea.  Neurological:  Negative for headaches.         Past Medical History:  Diagnosis Date   Hypertension     Social History   Socioeconomic History   Marital status: Married    Spouse name: Not on file   Number of children: Not on file   Years of education: Not on file   Highest education level: Some college, no degree  Occupational History   Not on file  Tobacco Use   Smoking status: Never   Smokeless tobacco: Never  Substance and Sexual Activity    Alcohol use: Yes    Alcohol/week: 1.0 standard drink of alcohol    Types: 1 Glasses of wine per week    Comment: wine- occasionally   Drug use: No   Sexual activity: Not on file  Other Topics Concern   Not on file  Social History Narrative   Married.   Three children.   Works at [employer].   Enjoys spending time at [employer], spending time with family.    Social Drivers of Corporate investment banker Strain: Low Risk  (09/10/2023)   Overall Financial Resource Strain (CARDIA)    Difficulty of Paying Living Expenses: Not hard at all  Food Insecurity: No Food Insecurity (09/10/2023)   Hunger Vital Sign    Worried About Running Out of Food in the Last Year: Never true    Ran Out of Food in the Last Year: Never true  Transportation Needs: No Transportation Needs (09/10/2023)   PRAPARE - Administrator, Civil Service (Medical): No    Lack of Transportation (Non-Medical): No  Physical Activity: Insufficiently Active (09/10/2023)   Exercise Vital Sign    Days of Exercise per Week: 2 days    Minutes of Exercise per Session: 20 min  Stress: No Stress Concern Present (09/10/2023)   [employer] of Occupational Health - Occupational Stress Questionnaire  Feeling of Stress: Not at all  Social Connections: Socially Integrated (09/10/2023)   Social Connection and Isolation Panel    Frequency of Communication with Friends and Family: More than three times a week    Frequency of Social Gatherings with Friends and Family: More than three times a week    Attends Religious Services: More than 4 times per year    Active Member of [employer] or Organizations: Yes    Attends Engineer, structural: More than 4 times per year    Marital Status: Married  Catering manager Violence: Not on file    Past Surgical History:  Procedure Laterality Date   ABDOMINAL HYSTERECTOMY     APPENDECTOMY     BREAST BIOPSY Right    x2   CESAREAN SECTION     x3   WISDOM TOOTH EXTRACTION       Family History  Problem Relation Age of Onset   Breast cancer Mother 71   Hypertension Mother    Anuerysm Mother    Lung cancer Father    Heart disease Father    Hypertension Father    Colon cancer Neg Hx    Esophageal cancer Neg Hx    Rectal cancer Neg Hx    Stomach cancer Neg Hx     No Known Allergies  Current Outpatient Medications on File Prior to Visit  Medication Sig Dispense Refill   lisinopril  (ZESTRIL ) 20 MG tablet Take 1 tablet (20 mg total) by mouth daily. for blood pressure 90 tablet 2   ZEPBOUND 5 MG/0.5ML Pen Inject 5 mg into the skin once a week.     No current facility-administered medications on file prior to visit.    BP 124/76   Pulse 68   Temp (!) 96.8 F (36 C) (Temporal)   Ht 5' 5 (1.651 m)   Wt 203 lb (92.1 kg)   SpO2 98%   BMI 33.78 kg/m  Objective:   Physical Exam Cardiovascular:     Rate and Rhythm: Normal rate and regular rhythm.  Pulmonary:     Effort: Pulmonary effort is normal.     Breath sounds: Normal breath sounds.  Musculoskeletal:     Cervical back: Neck supple.  Skin:    General: Skin is warm and dry.  Neurological:     Mental Status: She is alert and oriented to person, place, and time.  Psychiatric:        Mood and Affect: Mood normal.           Assessment & Plan:  Hyperlipidemia, unspecified hyperlipidemia type Assessment & Plan: Commended her on weight loss!  Continue Zepbound 5 mg weekly. Repeat lipid panel pending.  Orders: -     Lipid panel -     Hemoglobin A1c  Essential hypertension -     Basic metabolic panel with GFR  Nausea and vomiting, unspecified vomiting type Assessment & Plan: Associated with mammalian meat.   Checking alpha gal levels today given history of tick bite. Her symptoms could also be secondary to her Zepbound, discussed this today.    Orders: -     Alpha-Gal Panel  Class 1 obesity due to excess calories without serious comorbidity with body mass index (BMI) of 33.0  to 33.9 in adult Assessment & Plan: Commended her on weight loss! Encouraged to continue!  Continue Zepbound 5 mg weekly through employer.          Reshaun Briseno K Tascha Casares, NP

## 2023-09-10 NOTE — Assessment & Plan Note (Signed)
 Commended her on weight loss! Encouraged to continue!  Continue Zepbound 5 mg weekly through employer.

## 2023-09-10 NOTE — Assessment & Plan Note (Signed)
 Associated with mammalian meat.   Checking alpha gal levels today given history of tick bite. Her symptoms could also be secondary to her Zepbound, discussed this today.

## 2023-09-10 NOTE — Patient Instructions (Signed)
 Stop by the lab prior to leaving today. I will notify you of your results once received.   It was a pleasure to see you today!

## 2023-09-11 ENCOUNTER — Ambulatory Visit: Payer: Self-pay | Admitting: Primary Care

## 2023-09-12 LAB — LIPID PANEL
Chol/HDL Ratio: 2.9 ratio (ref 0.0–4.4)
Cholesterol, Total: 212 mg/dL — ABNORMAL HIGH (ref 100–199)
HDL: 72 mg/dL (ref >39)
LDL Chol Calc (NIH): 128 mg/dL — ABNORMAL HIGH (ref 0–99)
Triglycerides: 69 mg/dL (ref 0–149)
VLDL Cholesterol Cal: 12 mg/dL (ref 5–40)

## 2023-09-12 LAB — BASIC METABOLIC PANEL WITH GFR
BUN/Creatinine Ratio: 17 (ref 12–28)
BUN: 15 mg/dL (ref 8–27)
CO2: 21 mmol/L (ref 20–29)
Calcium: 9.3 mg/dL (ref 8.7–10.3)
Chloride: 108 mmol/L — ABNORMAL HIGH (ref 96–106)
Creatinine, Ser: 0.9 mg/dL (ref 0.57–1.00)
Glucose: 90 mg/dL (ref 70–99)
Potassium: 4.3 mmol/L (ref 3.5–5.2)
Sodium: 142 mmol/L (ref 134–144)
eGFR: 73 mL/min/1.73 (ref >59)

## 2023-09-12 LAB — HEMOGLOBIN A1C
Est. average glucose Bld gHb Est-mCnc: 88 mg/dL
Hgb A1c MFr Bld: 4.7 % — ABNORMAL LOW (ref 4.8–5.6)

## 2023-09-12 LAB — ALPHA-GAL PANEL
Allergen Lamb IgE: 0.46 kU/L — AB
Beef IgE: 1.9 kU/L — AB
IgE (Immunoglobulin E), Serum: 393 [IU]/mL (ref 6–495)
O215-IgE Alpha-Gal: 9.7 kU/L — AB
Pork IgE: 0.29 kU/L — AB

## 2023-11-04 ENCOUNTER — Ambulatory Visit
Admission: RE | Admit: 2023-11-04 | Discharge: 2023-11-04 | Disposition: A | Discharge status: Home / Self Care | Source: Ambulatory Visit | Attending: Primary Care

## 2023-11-04 ENCOUNTER — Ambulatory Visit

## 2023-11-04 ENCOUNTER — Ambulatory Visit: Payer: Self-pay | Admitting: Primary Care

## 2023-11-04 DIAGNOSIS — R928 Other abnormal and inconclusive findings on diagnostic imaging of breast: Secondary | ICD-10-CM

## 2023-11-04 DIAGNOSIS — N6489 Other specified disorders of breast: Secondary | ICD-10-CM

## 2023-11-26 ENCOUNTER — Other Ambulatory Visit: Payer: Self-pay | Admitting: Primary Care

## 2023-11-26 DIAGNOSIS — I10 Essential (primary) hypertension: Secondary | ICD-10-CM

## 2024-03-09 ENCOUNTER — Other Ambulatory Visit: Payer: Self-pay | Admitting: Primary Care

## 2024-03-09 DIAGNOSIS — I10 Essential (primary) hypertension: Secondary | ICD-10-CM

## 2024-03-09 NOTE — Telephone Encounter (Signed)
Patient is due for CPE/follow up in March, this will be required prior to any further refills.  Please schedule, thank you!
